# Patient Record
Sex: Female | Born: 1960 | Race: Black or African American | Hispanic: No | Marital: Single | Smoking: Never smoker
Health system: Southern US, Community
[De-identification: ages and names within clinical notes are randomized; demographics above are authoritative.]

## PROBLEM LIST (undated history)

## (undated) DIAGNOSIS — M5430 Sciatica, unspecified side: Secondary | ICD-10-CM

## (undated) DIAGNOSIS — J40 Bronchitis, not specified as acute or chronic: Secondary | ICD-10-CM

---

## 2002-06-11 ENCOUNTER — Emergency Department (HOSPITAL_COMMUNITY): Admission: EM | Admit: 2002-06-11 | Discharge: 2002-06-11 | Payer: Self-pay | Admitting: Emergency Medicine

## 2002-09-26 ENCOUNTER — Emergency Department (HOSPITAL_COMMUNITY): Admission: EM | Admit: 2002-09-26 | Discharge: 2002-09-26 | Payer: Self-pay | Admitting: Emergency Medicine

## 2002-10-03 ENCOUNTER — Inpatient Hospital Stay (HOSPITAL_COMMUNITY): Admission: AD | Admit: 2002-10-03 | Discharge: 2002-10-03 | Payer: Self-pay | Admitting: Family Medicine

## 2003-02-03 ENCOUNTER — Emergency Department (HOSPITAL_COMMUNITY): Admission: EM | Admit: 2003-02-03 | Discharge: 2003-02-03 | Payer: Self-pay | Admitting: Emergency Medicine

## 2003-02-03 ENCOUNTER — Encounter: Payer: Self-pay | Admitting: Emergency Medicine

## 2003-07-01 ENCOUNTER — Inpatient Hospital Stay (HOSPITAL_COMMUNITY): Admission: AD | Admit: 2003-07-01 | Discharge: 2003-07-01 | Payer: Self-pay | Admitting: Family Medicine

## 2003-08-23 ENCOUNTER — Emergency Department (HOSPITAL_COMMUNITY): Admission: EM | Admit: 2003-08-23 | Discharge: 2003-08-23 | Payer: Self-pay | Admitting: Emergency Medicine

## 2004-09-16 ENCOUNTER — Inpatient Hospital Stay (HOSPITAL_COMMUNITY): Admission: AD | Admit: 2004-09-16 | Discharge: 2004-09-16 | Payer: Self-pay | Admitting: Obstetrics & Gynecology

## 2006-07-21 ENCOUNTER — Emergency Department (HOSPITAL_COMMUNITY): Admission: EM | Admit: 2006-07-21 | Discharge: 2006-07-21 | Payer: Self-pay | Admitting: Emergency Medicine

## 2007-03-03 ENCOUNTER — Emergency Department (HOSPITAL_COMMUNITY): Admission: EM | Admit: 2007-03-03 | Discharge: 2007-03-03 | Payer: Self-pay | Admitting: Emergency Medicine

## 2007-03-05 ENCOUNTER — Emergency Department (HOSPITAL_COMMUNITY): Admission: EM | Admit: 2007-03-05 | Discharge: 2007-03-05 | Payer: Self-pay | Admitting: Emergency Medicine

## 2007-03-09 ENCOUNTER — Emergency Department (HOSPITAL_COMMUNITY): Admission: EM | Admit: 2007-03-09 | Discharge: 2007-03-09 | Payer: Self-pay | Admitting: Family Medicine

## 2009-02-10 ENCOUNTER — Emergency Department (HOSPITAL_COMMUNITY): Admission: EM | Admit: 2009-02-10 | Discharge: 2009-02-10 | Payer: Self-pay | Admitting: Emergency Medicine

## 2009-07-28 IMAGING — US US MULTISYS EXAM
1 series · 14 of 14 positions shown · non-contrast
Comparison: NONE

CLINICAL DATA: Right leg mass. 

RIGHT LOWER EXTREMITY ULTRASOUND

[Series 1: us rle non v · 0.03mm/px · 14 of 14 slices shown]
[im 1/14]
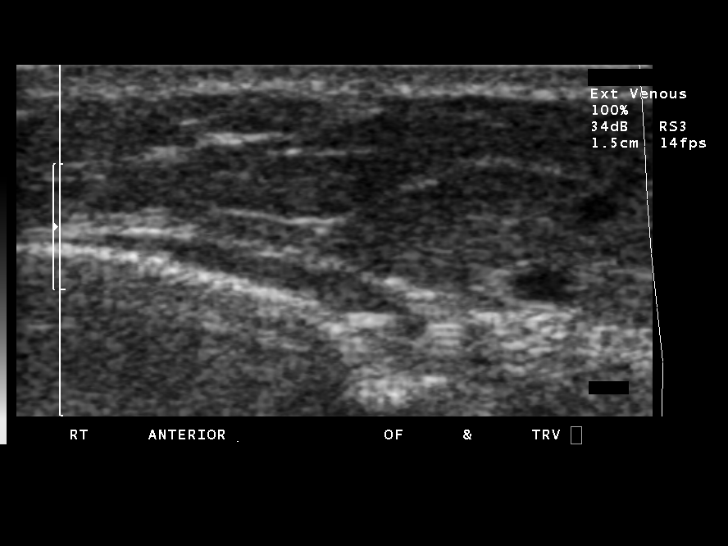
[im 2/14]
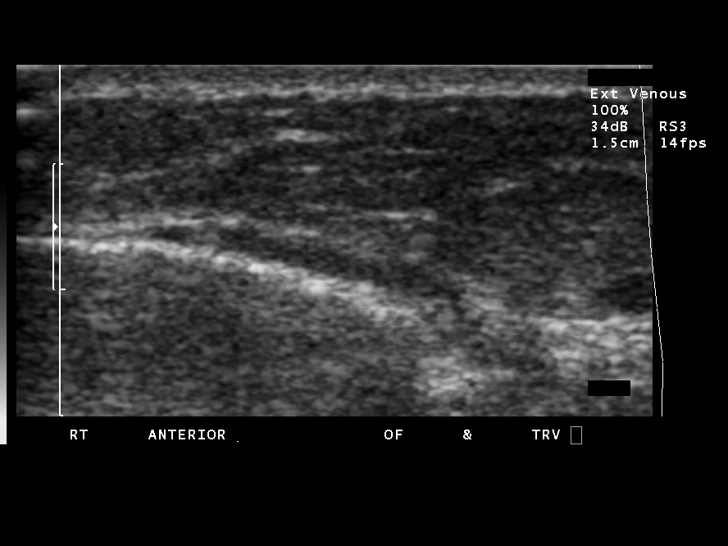
[im 3/14]
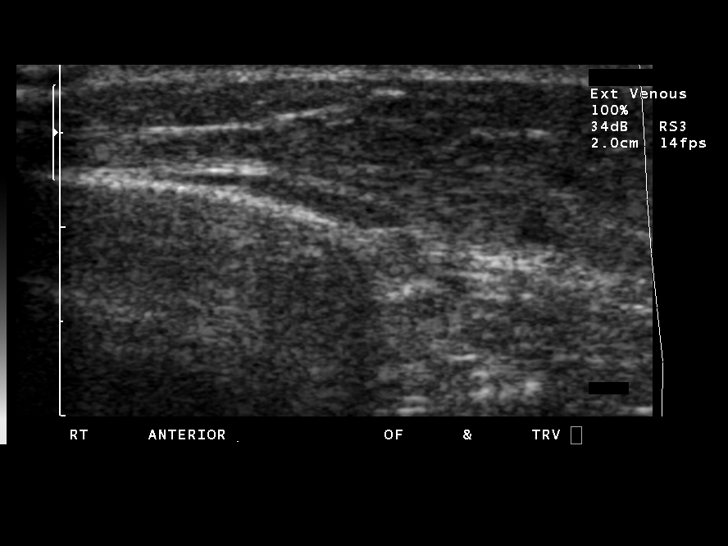
[im 4/14]
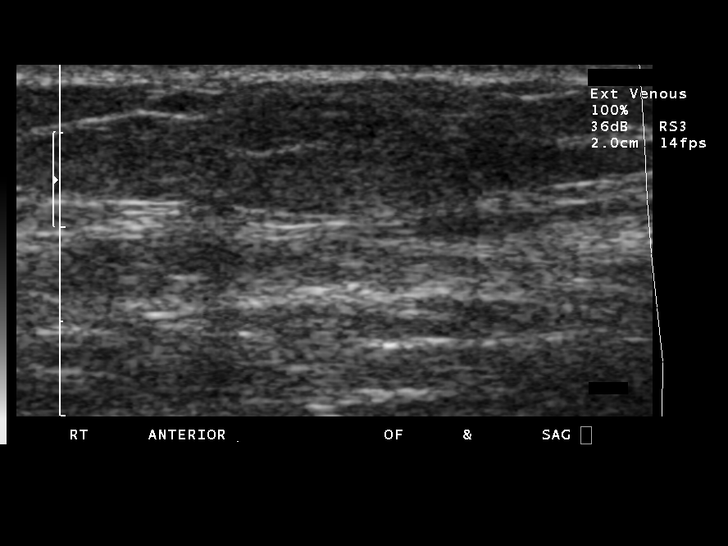
[im 5/14]
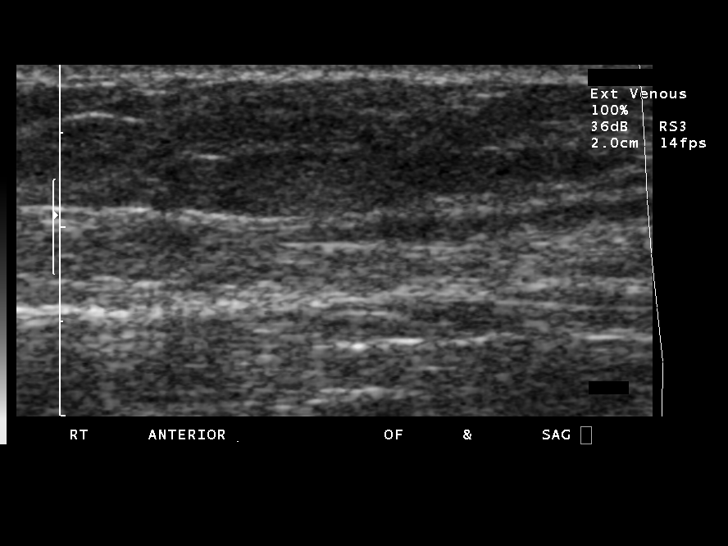
[im 6/14]
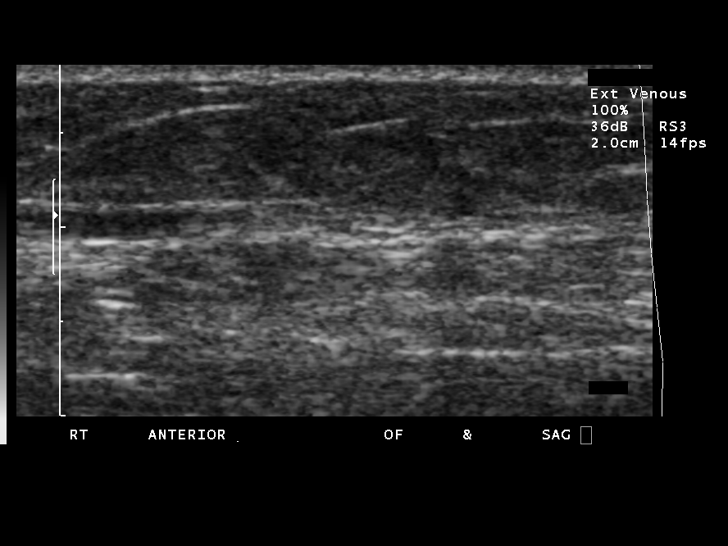
[im 7/14]
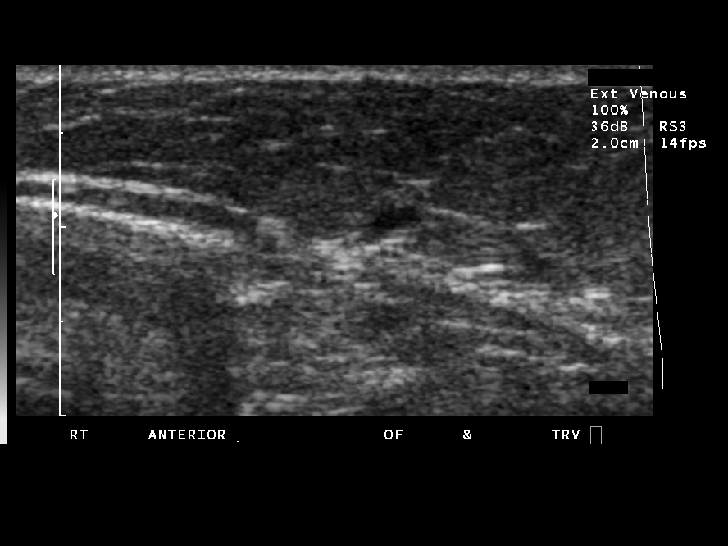
[im 8/14]
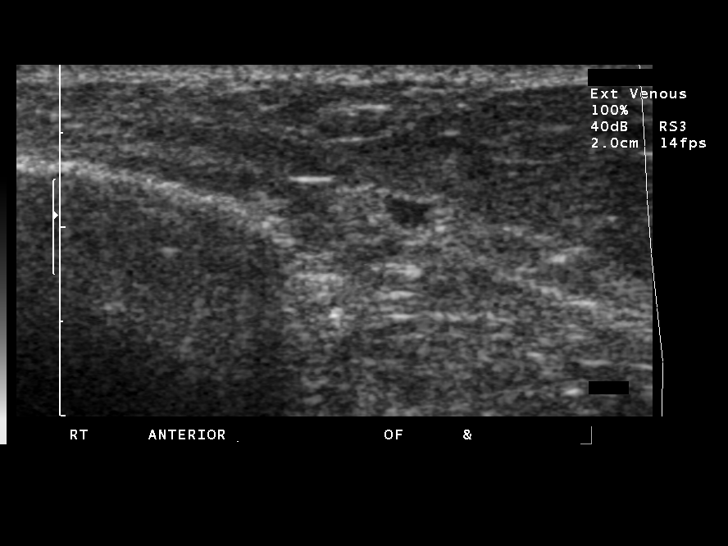
[im 9/14]
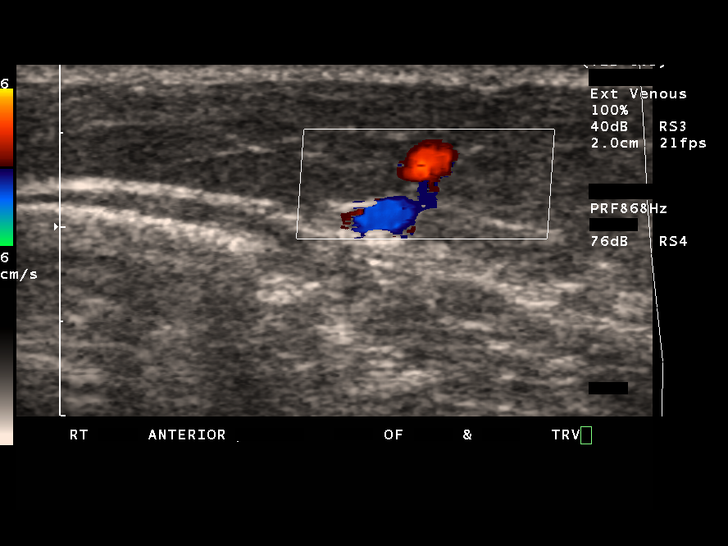
[im 10/14]
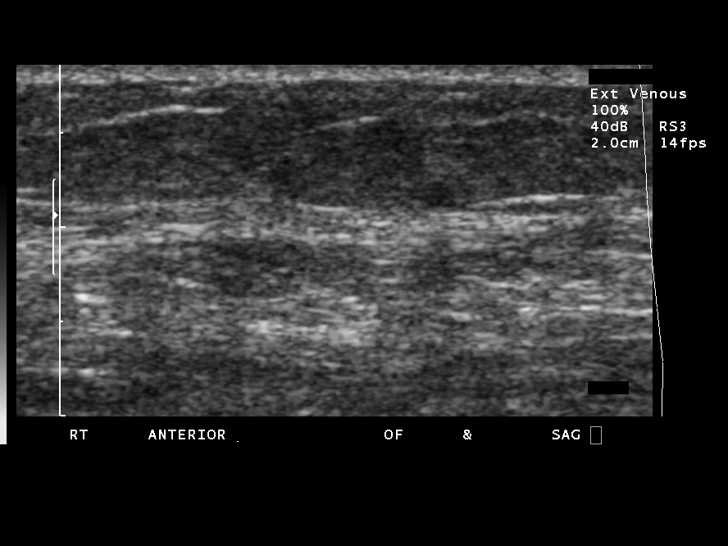
[im 11/14]
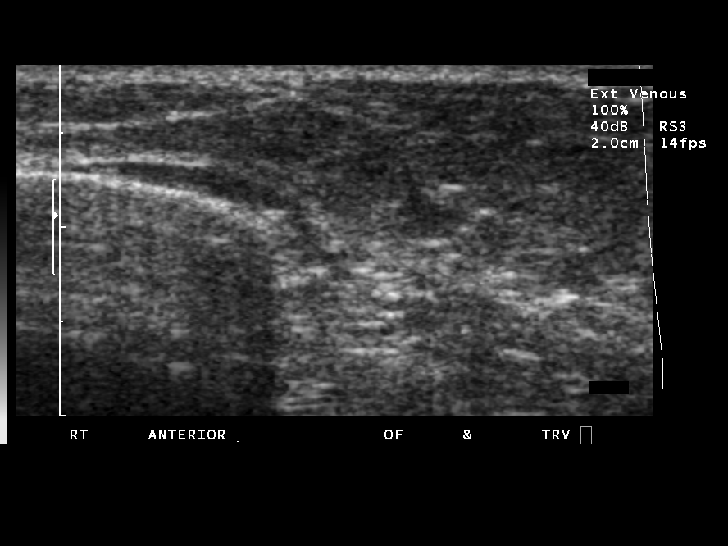
[im 12/14]
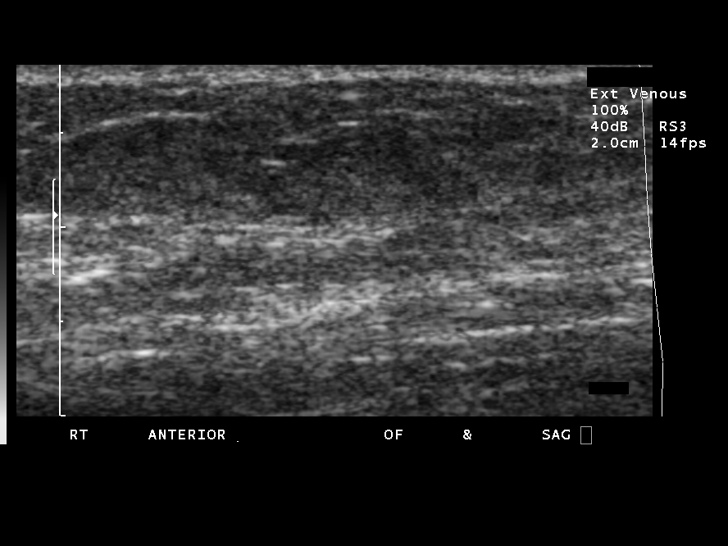
[im 13/14]
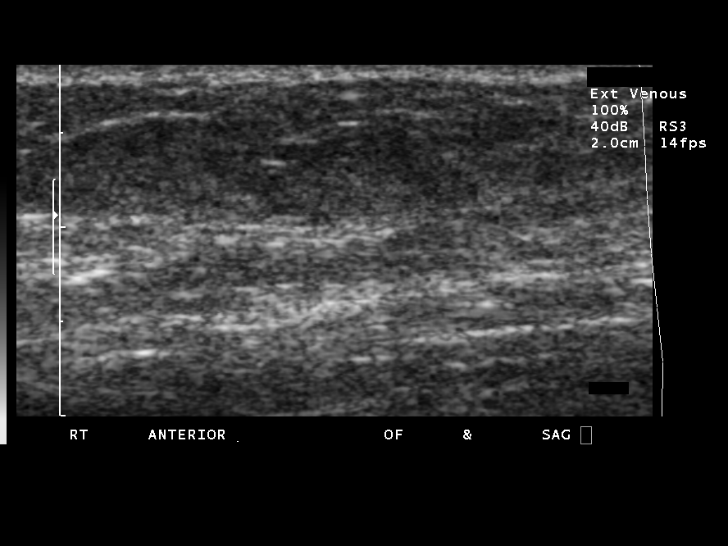
[im 14/14]
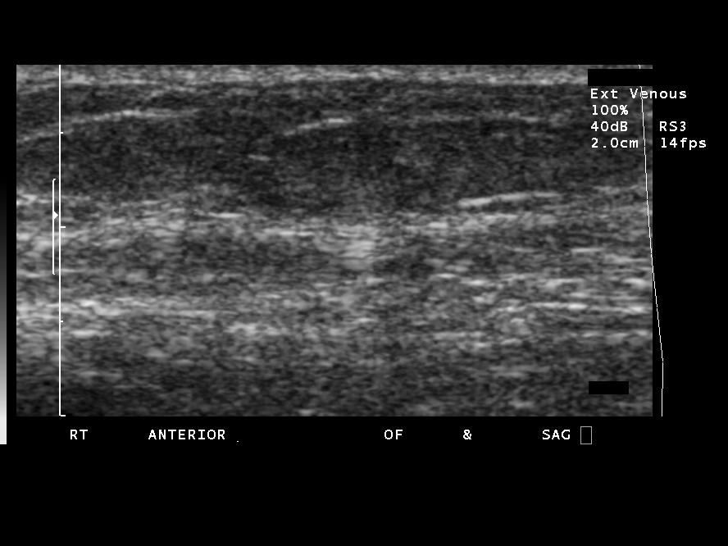

[14 of 14 positions shown; findings below may reference images not displayed]

FINDINGS: Evaluation of the palpable area in the right lower 
extremity demonstrates no evidence of focal or discrete mass.
IMPRESSION: No evidence of mass identified by ultrasound. If 
clinical symptoms persist or worsen, MRI may aid in further 
evaluation. Nerio, Tico electronically reviewed on 
05/12/2007 Dict Date: 05/10/2007  Tran Date:  05/12/2007 DAS  [REDACTED]

## 2010-09-01 LAB — POCT I-STAT, CHEM 8
BUN: 9 mg/dL (ref 6–23)
Calcium, Ion: 1.08 mmol/L — ABNORMAL LOW (ref 1.12–1.32)
Chloride: 107 meq/L (ref 96–112)
Creatinine, Ser: 1 mg/dL (ref 0.4–1.2)
Glucose, Bld: 90 mg/dL (ref 70–99)
HCT: 42 % (ref 36.0–46.0)
Hemoglobin: 14.3 g/dL (ref 12.0–15.0)
Potassium: 7.5 meq/L (ref 3.5–5.1)
Sodium: 137 meq/L (ref 135–145)
TCO2: 25 mmol/L (ref 0–100)

## 2010-09-01 LAB — BASIC METABOLIC PANEL
BUN: 8 mg/dL (ref 6–23)
Calcium: 8.9 mg/dL (ref 8.4–10.5)
Calcium: 9.5 mg/dL (ref 8.4–10.5)
Chloride: 110 mEq/L (ref 96–112)
Creatinine, Ser: 0.7 mg/dL (ref 0.4–1.2)
Creatinine, Ser: 0.81 mg/dL (ref 0.4–1.2)
GFR calc Af Amer: >60 mL/min (ref >60)
GFR calc non Af Amer: >60 mL/min (ref >60)
Glucose, Bld: 94 mg/dL (ref 70–99)

## 2010-09-01 LAB — URINALYSIS, ROUTINE W REFLEX MICROSCOPIC
Ketones, ur: NEGATIVE mg/dL
Nitrite: NEGATIVE
Protein, ur: NEGATIVE mg/dL

## 2010-09-01 LAB — CBC
Platelets: 223 10*3/uL (ref 150–400)
RDW: 12.9 % (ref 11.5–15.5)

## 2010-09-01 LAB — DIFFERENTIAL
Basophils Absolute: 0.1 10*3/uL (ref 0.0–0.1)
Eosinophils Relative: 1 % (ref 0–5)
Lymphocytes Relative: 42 % (ref 12–46)
Neutro Abs: 1.6 10*3/uL — ABNORMAL LOW (ref 1.7–7.7)
Neutrophils Relative %: 46 % (ref 43–77)

## 2010-09-01 LAB — HEMOCCULT GUIAC POC 1CARD (OFFICE): Fecal Occult Bld: NEGATIVE

## 2022-04-01 ENCOUNTER — Encounter (HOSPITAL_COMMUNITY): Payer: Self-pay | Admitting: Emergency Medicine

## 2022-04-01 ENCOUNTER — Emergency Department (HOSPITAL_COMMUNITY)
Admission: EM | Admit: 2022-04-01 | Discharge: 2022-04-01 | Disposition: A | Discharge status: Home / Self Care | Payer: Medicare Other | Attending: Emergency Medicine | Admitting: Emergency Medicine

## 2022-04-01 ENCOUNTER — Other Ambulatory Visit: Payer: Self-pay

## 2022-04-01 DIAGNOSIS — X500XXA Overexertion from strenuous movement or load, initial encounter: Secondary | ICD-10-CM | POA: Insufficient documentation

## 2022-04-01 DIAGNOSIS — M5441 Lumbago with sciatica, right side: Secondary | ICD-10-CM | POA: Diagnosis not present

## 2022-04-01 DIAGNOSIS — M542 Cervicalgia: Secondary | ICD-10-CM | POA: Diagnosis present

## 2022-04-01 DIAGNOSIS — S161XXA Strain of muscle, fascia and tendon at neck level, initial encounter: Secondary | ICD-10-CM | POA: Insufficient documentation

## 2022-04-01 MED ORDER — OXYCODONE HCL 5 MG PO TABS
5.0000 mg | ORAL_TABLET | Freq: Once | ORAL | Status: DC
  Filled 2022-04-01: qty 1

## 2022-04-01 MED ORDER — NAPROXEN SODIUM 550 MG PO TABS: 550.0000 mg | ORAL_TABLET | Freq: Two times a day (BID) | ORAL | 0 refills | Status: AC

## 2022-04-01 MED ORDER — ALBUTEROL SULFATE HFA 108 (90 BASE) MCG/ACT IN AERS
2.0000 | INHALATION_SPRAY | Freq: Once | RESPIRATORY_TRACT | Status: AC
  Administered 2022-04-01: 2 via RESPIRATORY_TRACT
  Filled 2022-04-01: qty 6.7

## 2022-04-01 MED ORDER — ACETAMINOPHEN 500 MG PO TABS
1000.0000 mg | ORAL_TABLET | Freq: Once | ORAL | Status: DC
  Filled 2022-04-01: qty 2

## 2022-04-01 MED ORDER — KETOROLAC TROMETHAMINE 15 MG/ML IJ SOLN
15.0000 mg | Freq: Once | INTRAMUSCULAR | Status: DC
  Filled 2022-04-01: qty 1

## 2022-04-01 NOTE — ED Triage Notes (Signed)
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 Pt states the pain runs from hip down to leg. Hx of sciatica, but states this feels worse than normal

## 2022-04-01 NOTE — ED Provider Notes (Signed)
[facility]-EMERGENCY DEPT Provider Note   CSN: [id number] Arrival date & time: 04/01/22  0418     History  Chief Complaint  Patient presents with   Back Pain    Holly Lutz is a 61 y.o. female.  61 yo F With a chief complaints of right-sided low back pain.  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  She also felt like she had strained her neck.  Has a history of right-sided low back pain.  Has radiation down into the buttock.  She denies trauma denies loss of bowel or bladder denies loss of rectal sensation denies numbness or weakness to the leg.  The neck pain is to the left lateral side.  Worse with certain positions and palpation.  She denies numbness or weakness to her arms.   Back Pain      Home Medications Prior to Admission medications   Not on File      Allergies    Patient has no allergy information on record.    Review of Systems   Review of Systems  Musculoskeletal:  Positive for back pain.    Physical Exam Updated Vital Signs BP 112/77   Pulse 73   Temp 98 F (36.7 C) (Oral)   Resp 18   Ht 5\' 5"  (1.651 m)   Wt 104.3 kg   SpO2 98%   BMI 38.27 kg/m  Physical Exam Vitals and nursing note reviewed.  Constitutional:      General: She is not in acute distress.    Appearance: She is well-developed. She is not diaphoretic.  HENT:     Head: Normocephalic and atraumatic.  Eyes:     Pupils: Pupils are equal, round, and reactive to light.  Cardiovascular:     Rate and Rhythm: Normal rate and regular rhythm.     Heart sounds: No murmur heard.    No friction rub. No gallop.  Pulmonary:     Effort: Pulmonary effort is normal.     Breath sounds: No wheezing or rales.  Abdominal:     General: There is no distension.     Palpations: Abdomen is soft.     Tenderness: There is no abdominal tenderness.  Musculoskeletal:        General: No tenderness.     Cervical back: Normal  range of motion and neck supple.     Comments: Pulse motor and sensation intact to bilateral lower extremities.  Reflexes are 2+ there is no clonus negative Babinski bilaterally.  Negative straight leg raise test bilaterally.  Skin:    General: Skin is warm and dry.  Neurological:     Mental Status: She is alert and oriented to person, place, and time.  Psychiatric:        Behavior: Behavior normal.     ED Results / Procedures / Treatments   Labs (all labs ordered are listed, but only abnormal results are displayed) Labs Reviewed - No data to display  EKG None  Radiology No results found.  Procedures Procedures    Medications Ordered in ED Medications  acetaminophen (TYLENOL) tablet 1,000 mg (has no administration in time range)  ketorolac (TORADOL) 15 MG/ML injection 15 mg (has no administration in time range)  oxyCODONE (Oxy IR/ROXICODONE) immediate release tablet 5 mg (has no administration in time range)  albuterol (VENTOLIN HFA) 108 (90 Base) MCG/ACT inhaler 2 puff (has no administration in time range)    ED  Course/ Medical Decision Making/ A&P                           Medical Decision Making Risk OTC drugs. Prescription drug management.   61 yo F With a chief complaints of right-sided low back pain.  This is in the setting of her lifting heavy objects.  Atraumatic benign exam.  We will treat as musculoskeletal.  PCP follow-up.  She is also complaining of cough and has run out of her inhaler.  No wheezing on my exam.  We will give her an inhaler to take home.  Patient was also complaining of neck pain.  Benign exam.  Consistent with strain.  Will discharge home.  6:19 AM:  I have discussed the diagnosis/risks/treatment options with the patient.  Evaluation and diagnostic testing in the emergency department does not suggest an emergent condition requiring admission or immediate intervention beyond what has been performed at this time.  They will follow up with  PCP. We also discussed returning to the ED immediately if new or worsening sx occur. We discussed the sx which are most concerning (e.g., sudden worsening pain, fever, inability to tolerate by mouth, cauda equina s/sx) that necessitate immediate return. Medications administered to the patient during their visit and any new prescriptions provided to the patient are listed below.  Medications given during this visit Medications  acetaminophen (TYLENOL) tablet 1,000 mg (has no administration in time range)  ketorolac (TORADOL) 15 MG/ML injection 15 mg (has no administration in time range)  oxyCODONE (Oxy IR/ROXICODONE) immediate release tablet 5 mg (has no administration in time range)  albuterol (VENTOLIN HFA) 108 (90 Base) MCG/ACT inhaler 2 puff (has no administration in time range)     The patient appears reasonably screen and/or stabilized for discharge and I doubt any other medical condition or other [facility] requiring further screening, evaluation, or treatment in the ED at this time prior to discharge.          Final Clinical Impression(s) / ED Diagnoses Final diagnoses:  Acute right-sided low back pain with right-sided sciatica  Acute strain of neck muscle, initial encounter    Rx / DC Orders ED Discharge Orders     None         Deno Etienne, DO 04/01/22 [phone]

## 2022-04-01 NOTE — Discharge Instructions (Signed)

## 2022-04-14 ENCOUNTER — Other Ambulatory Visit: Payer: Self-pay

## 2022-04-14 ENCOUNTER — Encounter (HOSPITAL_COMMUNITY): Payer: Self-pay | Admitting: Emergency Medicine

## 2022-04-14 ENCOUNTER — Emergency Department (HOSPITAL_COMMUNITY): Payer: Medicare Other

## 2022-04-14 ENCOUNTER — Emergency Department (HOSPITAL_COMMUNITY)
Admission: EM | Admit: 2022-04-14 | Discharge: 2022-04-14 | Discharge status: Against Medical Advice | Payer: Medicare Other | Attending: Emergency Medicine | Admitting: Emergency Medicine

## 2022-04-14 DIAGNOSIS — M5442 Lumbago with sciatica, left side: Secondary | ICD-10-CM | POA: Diagnosis not present

## 2022-04-14 DIAGNOSIS — M5441 Lumbago with sciatica, right side: Secondary | ICD-10-CM | POA: Diagnosis not present

## 2022-04-14 DIAGNOSIS — M5440 Lumbago with sciatica, unspecified side: Secondary | ICD-10-CM

## 2022-04-14 DIAGNOSIS — R32 Unspecified urinary incontinence: Secondary | ICD-10-CM | POA: Diagnosis not present

## 2022-04-14 DIAGNOSIS — R052 Subacute cough: Secondary | ICD-10-CM | POA: Diagnosis not present

## 2022-04-14 DIAGNOSIS — M545 Low back pain, unspecified: Secondary | ICD-10-CM | POA: Diagnosis present

## 2022-04-14 LAB — CBC WITH DIFFERENTIAL/PLATELET
Abs Immature Granulocytes: 0.01 10*3/uL (ref 0.00–0.07)
Basophils Absolute: 0.1 10*3/uL (ref 0.0–0.1)
Basophils Relative: 1 %
Eosinophils Absolute: 0.1 10*3/uL (ref 0.0–0.5)
Eosinophils Relative: 2 %
HCT: 38.9 % (ref 36.0–46.0)
Hemoglobin: 12.9 g/dL (ref 12.0–15.0)
Immature Granulocytes: 0 %
Lymphocytes Relative: 30 %
Lymphs Abs: 1.6 10*3/uL (ref 0.7–4.0)
MCH: 29.1 pg (ref 26.0–34.0)
MCHC: 33.2 g/dL (ref 30.0–36.0)
MCV: 87.6 fL (ref 80.0–100.0)
Monocytes Absolute: 0.4 10*3/uL (ref 0.1–1.0)
Monocytes Relative: 8 %
Neutro Abs: 3.2 10*3/uL (ref 1.7–7.7)
Neutrophils Relative %: 59 %
Platelets: 266 10*3/uL (ref 150–400)
RBC: 4.44 MIL/uL (ref 3.87–5.11)
RDW: 12.8 % (ref 11.5–15.5)
WBC: 5.4 10*3/uL (ref 4.0–10.5)
nRBC: 0 % (ref 0.0–0.2)

## 2022-04-14 LAB — BASIC METABOLIC PANEL
Anion gap: 10 (ref 5–15)
BUN: 11 mg/dL (ref 8–23)
CO2: 24 mmol/L (ref 22–32)
Calcium: 9.7 mg/dL (ref 8.9–10.3)
Chloride: 107 mmol/L (ref 98–111)
Creatinine, Ser: 0.96 mg/dL (ref 0.44–1.00)
GFR, Estimated: >60 mL/min (ref >60)
Glucose, Bld: 110 mg/dL — ABNORMAL HIGH (ref 70–99)
Potassium: 3.3 mmol/L — ABNORMAL LOW (ref 3.5–5.1)
Sodium: 141 mmol/L (ref 135–145)

## 2022-04-14 LAB — URINALYSIS, ROUTINE W REFLEX MICROSCOPIC
Bilirubin Urine: NEGATIVE
Glucose, UA: NEGATIVE mg/dL
Hgb urine dipstick: NEGATIVE
Ketones, ur: NEGATIVE mg/dL
Leukocytes,Ua: NEGATIVE
Nitrite: NEGATIVE
Protein, ur: NEGATIVE mg/dL
Specific Gravity, Urine: 1.017 (ref 1.005–1.030)
pH: 6 (ref 5.0–8.0)

## 2022-04-14 MED ORDER — AMOXICILLIN-POT CLAVULANATE 875-125 MG PO TABS: 1.0000 | ORAL_TABLET | Freq: Two times a day (BID) | ORAL | 0 refills | Status: AC

## 2022-04-14 NOTE — ED Triage Notes (Signed)
Pt brought to ED by [facility] with c/o bladder pain, sciatic nerve pain, and possible UTI. Pt reports urinary urgency and frequency.   EMS Vitals BP 134/80 HR 92 SPO2 96% RA

## 2022-04-14 NOTE — ED Provider Notes (Signed)
[facility] EMERGENCY DEPARTMENT Provider Note   CSN: [id number] Arrival date & time: 04/14/22  0111     History No chief complaint on file.   Holly Lutz is a 61 y.o. female.  Patient presenting with the emergency department with multiple complaints.  First she has had a cough for over a month now.  She says she sometimes coughs up yellow sputum.  She was diagnosed with bronchitis and was given an inhaler that did not help at all.  She thinks she might need an antibiotic at this point.  She feels like her symptoms are just worsening.  She had some associated shortness of breath.  No chest pain.  Her other complaint is that she now has some urinary issues.  She says over the past 3 days she has had some pressure in her suprapubic area as well as feeling like she is leaking urine.  She says when she coughs a lot at times the urine comes out.  She also states that it will come out other times when she is not expecting it.  She states when she tries to go to the bathroom in the toilet that nothing comes out, however she feels like she has a lot of urine. This is not normal for her at all. She is concerned for UTI.  She also reports that she was recently seen here about one week ago after lifting heavy objects and experiencing some new lower back pain that is worsened from her chronic sciatica.  She has been taking naproxen for this without much relief.  HPI     Home Medications Prior to Admission medications   Medication Sig Start Date End Date Taking? Authorizing Provider  amoxicillin-clavulanate (AUGMENTIN) 875-125 MG tablet Take 1 tablet by mouth every 12 (twelve) hours for 5 days. 04/14/22 04/19/22 Yes Amal Renbarger, Finis Bud, PA-C  naproxen sodium (ANAPROX DS) 550 MG tablet Take 1 tablet (550 mg total) by mouth 2 (two) times daily with a meal for 14 days. 04/01/22 04/15/22  Melene Plan, DO      Allergies    Patient has no allergy information on record.    Review of  Systems   Review of Systems  Respiratory:  Positive for cough and shortness of breath.   Gastrointestinal:  Positive for abdominal pain.  Genitourinary:  Positive for difficulty urinating.       Urinary incontinance  Musculoskeletal:  Positive for back pain.  All other systems reviewed and are negative.   Physical Exam Updated Vital Signs BP (!) 151/79   Pulse 66   Temp 97.9 F (36.6 C) (Oral)   Resp 17   SpO2 96%  Physical Exam Vitals and nursing note reviewed.  Constitutional:      General: She is not in acute distress.    Appearance: Normal appearance. She is not ill-appearing, toxic-appearing or diaphoretic.  HENT:     Head: Normocephalic and atraumatic.     Nose: No nasal deformity.     Mouth/Throat:     Lips: Pink. No lesions.     Mouth: Mucous membranes are moist. No injury, lacerations, oral lesions or angioedema.     Pharynx: Oropharynx is clear. Uvula midline. No pharyngeal swelling, oropharyngeal exudate, posterior oropharyngeal erythema or uvula swelling.  Eyes:     General: Gaze aligned appropriately. No scleral icterus.       Right eye: No discharge.        Left eye: No discharge.     Conjunctiva/sclera: Conjunctivae  normal.     Right eye: Right conjunctiva is not injected. No exudate or hemorrhage.    Left eye: Left conjunctiva is not injected. No exudate or hemorrhage.    Pupils: Pupils are equal, round, and reactive to light.  Cardiovascular:     Rate and Rhythm: Normal rate and regular rhythm.     Pulses: Normal pulses.          Radial pulses are 2+ on the right side and 2+ on the left side.       Dorsalis pedis pulses are 2+ on the right side and 2+ on the left side.     Heart sounds: Normal heart sounds, S1 normal and S2 normal. Heart sounds not distant. No murmur heard.    No friction rub. No gallop. No S3 or S4 sounds.  Pulmonary:     Effort: Pulmonary effort is normal. No accessory muscle usage or respiratory distress.     Breath sounds: Normal  breath sounds. No stridor. No wheezing, rhonchi or rales.     Comments: Nonproductive cough noted Chest:     Chest wall: No tenderness.  Abdominal:     General: Abdomen is flat. There is no distension.     Palpations: Abdomen is soft. There is no mass or pulsatile mass.     Tenderness: There is abdominal tenderness. There is no guarding or rebound.     Comments: Suprapubic tenderness  Musculoskeletal:     Right lower leg: No edema.     Left lower leg: No edema.     Comments: No midline tenderness of spine, no stepoff or deformity; reproducible muscular tenderness in paraspinal muscles DP/PT pulses 2+ and equal bilaterally No leg edema Sensation grossly intact on anterior thighs, dorsum of foot and lateral foot Decreased strength of RLE, normal strength of LLE    Skin:    General: Skin is warm and dry.     Coloration: Skin is not jaundiced or pale.     Findings: No bruising, erythema, lesion or rash.  Neurological:     General: No focal deficit present.     Mental Status: She is alert and oriented to person, place, and time.     GCS: GCS eye subscore is 4. GCS verbal subscore is 5. GCS motor subscore is 6.  Psychiatric:        Mood and Affect: Mood normal.        Behavior: Behavior normal. Behavior is cooperative.     ED Results / Procedures / Treatments   Labs (all labs ordered are listed, but only abnormal results are displayed) Labs Reviewed  BASIC METABOLIC PANEL - Abnormal; Notable for the following components:      Result Value   Potassium 3.3 (*)    Glucose, Bld 110 (*)    All other components within normal limits  URINALYSIS, ROUTINE W REFLEX MICROSCOPIC - Abnormal; Notable for the following components:   APPearance HAZY (*)    All other components within normal limits  URINE CULTURE  CBC WITH DIFFERENTIAL/PLATELET    EKG None  Radiology DG Chest 2 View  Result Date: 04/14/2022 CLINICAL DATA:  Pneumonia.  Bronchitis and cough.  Syncope. EXAM: CHEST - 2  VIEW COMPARISON:  March 05, 2007 FINDINGS: The heart size and mediastinal contours are within normal limits. Both lungs are clear. The visualized skeletal structures are unremarkable. IMPRESSION: No active cardiopulmonary disease. Electronically Signed   By: Gerome Sam III M.D.   On: 04/14/2022 08:54  Procedures Procedures   Medications Ordered in ED Medications - No data to display  ED Course/ Medical Decision Making/ A&P Clinical Course as of 04/14/22 1214  Sat Apr 14, 2022  4469 Patient voided over 300 cc, has 250 cc residual in bladder scan. Ordered MRI lumbar to r/o cauda equina [GL]  1048 Patient elects to leave AMA. I recommended MRI to r/o cauda equina given urinary retention issues. I discussed that she does not have a UTI and abx are not indicated. I recommend courses of steroids for her bronchitis, but no sign of pneumonia was found today. I did not recommend antibiotics for this, but she refused steroids and insisted on antibiotic prescription [GL]    Clinical Course User Index [GL] Canon Gola, Finis Bud, PA-C                           Medical Decision Making Amount and/or Complexity of Data Reviewed Radiology: ordered.  Risk Prescription drug management.   She is presenting with multiple complaints.  She has a cough that is been going on for over a month now.  He was diagnosed with bronchitis then.  She has not responded to inhaler therapy.  She says is happened to her multiple times that she only responds antibiotics.  We got a chest x-ray which did not reveal any sign of pneumonia.  Her lungs are clear on exam.  Is not febrile does not have an elevated white count.  I do not feel this is pneumonia, however patient is insistent on antibiotic prescription.  I have offered steroid therapy, but she has declined.  She also has this new urinary incontinence.  The way her description sounds, and almost sounds like she may have some overflow incontinence.  She was able to  urinate for [place] to get a urine sample which did not show any sign of urinary tract infection.  We had a postvoid residual which still had 250 cc residual in the bladder.  No AKI associated. Exam otherwise reassuring. In the setting of recent back injury and lower back pain, this is concerning for possible cauda equina syndrome.  I recommend an MRI the patient, however she refuses this and does not feel anything is wrong with her spine.  She feels that she has a urinary tract infection.  She says that she needs to leave now.  I have discussed with her that she will need to leave AGAINST MEDICAL ADVICE.  She is given return precautions if her symptoms worsen.   Final Clinical Impression(s) / ED Diagnoses Final diagnoses:  Urinary incontinence, unspecified type  Acute bilateral low back pain with sciatica, sciatica laterality unspecified  Subacute cough    Rx / DC Orders ED Discharge Orders          Ordered    amoxicillin-clavulanate (AUGMENTIN) 875-125 MG tablet  Every 12 hours        04/14/22 1042              Daina Cara, Finis Bud, PA-C 04/14/22 1217    Vanetta Mulders, MD 04/17/22 1544

## 2022-04-14 NOTE — ED Notes (Signed)
Asked pt about urine sample, pt keeps forgetting to provide one when pt goes to bathroom. Pt stated "will get it next time pt goes to bathroom".

## 2022-04-14 NOTE — ED Notes (Signed)
Patient ambulating to restroom to provide urine specimen

## 2022-04-14 NOTE — ED Provider Triage Note (Signed)
Emergency Medicine Provider Triage Evaluation Note  Laycee Fitzsimmons , a 61 y.o. female  was evaluated in triage.  Pt complains of sciatica, urinary frequency/dysuria, and concern for UTI.  Symptoms ongoing x3 days.  Denies fever.  Review of Systems  Positive: Dysuria, frequency, back pain Negative: fever  Physical Exam  BP (!) 151/79   Pulse 66   Temp 97.9 F (36.6 C) (Oral)   Resp 17   SpO2 96%   Gen:   Awake, no distress   Resp:  Normal effort  MSK:   Moves extremities without difficulty  Other:    Medical Decision Making  Medically screening exam initiated at 1:13 AM.  Appropriate orders placed.  Adelena Desantiago was informed that the remainder of the evaluation will be completed by another provider, this initial triage assessment does not replace that evaluation, and the importance of remaining in the ED until their evaluation is complete.     Garlon Hatchet, PA-C 04/14/22 2212

## 2022-04-14 NOTE — Discharge Instructions (Signed)
You have chosen to leave AGAINST MEDICAL ADVICE. Should you change your mind, you are always welcome and encouraged to return to the ED. You are encouraged to follow-up with, at the very least, a primary care provider, or other similar medical professional on this matter.   I sent antibiotic with you. Please return if symptoms become worse.

## 2022-04-15 ENCOUNTER — Emergency Department (HOSPITAL_COMMUNITY)
Admission: EM | Admit: 2022-04-15 | Discharge: 2022-04-16 | Disposition: A | Discharge status: Home / Self Care | Payer: Medicare Other | Attending: Emergency Medicine | Admitting: Emergency Medicine

## 2022-04-15 ENCOUNTER — Emergency Department (HOSPITAL_COMMUNITY): Payer: Medicare Other

## 2022-04-15 ENCOUNTER — Other Ambulatory Visit: Payer: Self-pay

## 2022-04-15 DIAGNOSIS — M5441 Lumbago with sciatica, right side: Secondary | ICD-10-CM | POA: Insufficient documentation

## 2022-04-15 DIAGNOSIS — M549 Dorsalgia, unspecified: Secondary | ICD-10-CM | POA: Diagnosis present

## 2022-04-15 LAB — URINE CULTURE

## 2022-04-15 NOTE — ED Triage Notes (Signed)
Pt brought to ED by [facility] with c/o sciatic nerve pain. Also endorses urinary pain.    EMS Vitals BP 120/80 HR 73 RR 18 SPO2 96%

## 2022-04-15 NOTE — ED Provider Triage Note (Signed)
Emergency Medicine Provider Triage Evaluation Note  Holly Lutz , a 61 y.o. female  was evaluated in triage.  Pt complains of sciatica pain after lifting heavy item today.  Also notes pain to her neck and shoulders after lifting heavy items because she was moving.  Notes that her sciatic pain radiates down the posterior right thigh. No meds tried PTA.  Notes that she was upset that she was not given any medications for her pain.  Patient also notes that she has bronchitis and notes that she was not evaluated for that.  Patient denies bowel incontinence, saddle paresthesia.  Notes that she has urinary incontinence every time that she is coughs.   Per patient chart review: Patient was evaluated emergency department yesterday for similar symptoms.  At that time patient had MRI lumbar ordered however patient left AMA.  Review of Systems  Positive:  Negative:   Physical Exam  BP 130/71   Pulse 82   Temp 98 F (36.7 C)   Resp 19   SpO2 100%  Gen:   Awake, no distress  Resp:  Normal effort  MSK:   Moves extremities without difficulty  Other:  +SLR left.  No spinal tenderness to palpation.  Tenderness to palpation noted to musculature of back on the left.  Medical Decision Making  Medically screening exam initiated at 11:00 PM.  Appropriate orders placed.  Holly Lutz was informed that the remainder of the evaluation will be completed by another provider, this initial triage assessment does not replace that evaluation, and the importance of remaining in the ED until their evaluation is complete.     Holly Lutz A, PA-C 04/15/22 2300

## 2022-04-16 LAB — URINALYSIS, ROUTINE W REFLEX MICROSCOPIC
Bilirubin Urine: NEGATIVE
Glucose, UA: NEGATIVE mg/dL
Hgb urine dipstick: NEGATIVE
Ketones, ur: NEGATIVE mg/dL
Leukocytes,Ua: NEGATIVE
Nitrite: NEGATIVE
Protein, ur: NEGATIVE mg/dL
Specific Gravity, Urine: 1.016 (ref 1.005–1.030)
pH: 5 (ref 5.0–8.0)

## 2022-04-16 MED ORDER — OXYCODONE-ACETAMINOPHEN 5-325 MG PO TABS
1.0000 | ORAL_TABLET | Freq: Once | ORAL | Status: DC
  Filled 2022-04-16: qty 1

## 2022-04-16 MED ORDER — CYCLOBENZAPRINE HCL 10 MG PO TABS: 10.0000 mg | ORAL_TABLET | Freq: Two times a day (BID) | ORAL | 0 refills | Status: AC | PRN

## 2022-04-16 MED ORDER — NAPROXEN 250 MG PO TABS
500.0000 mg | ORAL_TABLET | Freq: Once | ORAL | Status: AC
  Administered 2022-04-16: 500 mg via ORAL
  Filled 2022-04-16: qty 2

## 2022-04-16 MED ORDER — PREDNISONE 10 MG PO TABS: 30.0000 mg | ORAL_TABLET | Freq: Every day | ORAL | 0 refills | Status: AC

## 2022-04-16 NOTE — ED Notes (Signed)
Patient verbalizes understanding of d/c instructions. Opportunities for questions and answers were provided. Pt d/c from ED and wheeled to lobby where she is waiting for bus.

## 2022-04-16 NOTE — ED Provider Notes (Signed)
[facility] EMERGENCY DEPARTMENT Provider Note   CSN: [id number] Arrival date & time: 04/15/22  2241     History  Chief Complaint  Patient presents with   Back Pain    Siearra Lutz is a 61 y.o. female.  HPI   Patient medical history including bronchitis, presents with complaints of back pain, patient has back pains for about 1 week ago, states it started after she was trying to move some things within the storage unit, states she feels pain in her lower back around her right side, will feel pain radiate down her right leg with occasional paresthesias, she will occasionally have saddle paresthesias but has not this time, she does note that she is noticing some urinary incontinency, she states that initially was just with coughing but now will happen without her coughing, no bowel incontinency, she denies any traumatic injury, no illicit drug use, denies fevers chills cough congestion general body aches.  She does not endorse urinary urgency frequency dysuria no flank tenderness no suprapubic pain   Reviewed patient's chart which she was seen 2 days ago for similar presentation, at that time postvoid residual was obtained and she had 225 cc after voiding, it was recommended for MRI for rule out of possible spine equina, patient left AMA.    Home Medications Prior to Admission medications   Medication Sig Start Date End Date Taking? Authorizing Provider  cyclobenzaprine (FLEXERIL) 10 MG tablet Take 1 tablet (10 mg total) by mouth 2 (two) times daily as needed for muscle spasms. 04/16/22  Yes Carroll Sage, PA-C  predniSONE (DELTASONE) 10 MG tablet Take 3 tablets (30 mg total) by mouth daily for 5 days. 04/16/22 04/21/22 Yes Carroll Sage, PA-C  amoxicillin-clavulanate (AUGMENTIN) 875-125 MG tablet Take 1 tablet by mouth every 12 (twelve) hours for 5 days. 04/14/22 04/19/22  Loeffler, Finis Bud, PA-C      Allergies    Patient has no allergy information  on record.    Review of Systems   Review of Systems  Constitutional:  Negative for chills and fever.  Respiratory:  Negative for shortness of breath.   Cardiovascular:  Negative for chest pain.  Gastrointestinal:  Negative for abdominal pain.  Musculoskeletal:  Positive for back pain.  Neurological:  Negative for headaches.    Physical Exam Updated Vital Signs BP 130/71   Pulse 82   Temp 98 F (36.7 C)   Resp 19   SpO2 100%  Physical Exam Vitals and nursing note reviewed.  Constitutional:      General: She is not in acute distress.    Appearance: She is not ill-appearing.  HENT:     Head: Normocephalic and atraumatic.     Nose: No congestion.  Eyes:     Conjunctiva/sclera: Conjunctivae normal.  Cardiovascular:     Rate and Rhythm: Normal rate and regular rhythm.     Pulses: Normal pulses.  Pulmonary:     Effort: Pulmonary effort is normal.  Abdominal:     Palpations: Abdomen is soft.     Tenderness: There is no abdominal tenderness. There is no right CVA tenderness or left CVA tenderness.  Musculoskeletal:     Comments: Spine was palpated was tender to palpation along the lumbar spine without crepitus or deformities noted, she did have some tenderness along the muscles that surround the right iliac crest, she has no pelvis instability no leg shortening, she has 5 out of 5 strength in the lower extremities, she has 2+  dorsal pedal pulses, she endorses some slight decrease in sensation to light touch on the right leg in comparison to the left, she has 2+ patella and Achilles tendon reflexes.  Skin:    General: Skin is warm and dry.  Neurological:     Mental Status: She is alert.  Psychiatric:        Mood and Affect: Mood normal.     ED Results / Procedures / Treatments   Labs (all labs ordered are listed, but only abnormal results are displayed) Labs Reviewed  URINALYSIS, ROUTINE W REFLEX MICROSCOPIC - Abnormal; Notable for the following components:      Result  Value   APPearance HAZY (*)    Bacteria, UA RARE (*)    All other components within normal limits    EKG None  Radiology DG Cervical Spine Complete  Result Date: 04/16/2022 CLINICAL DATA:  Neck pain EXAM: CERVICAL SPINE - COMPLETE 4+ VIEW COMPARISON:  None Available. FINDINGS: No static subluxation. Reversal of normal cervical lordosis. Mild multilevel degenerative disc disease. No compression fracture. No prevertebral soft tissue swelling. IMPRESSION: Mild multilevel degenerative disc disease. No compression fracture or static subluxation. Electronically Signed   By: Deatra Robinson M.D.   On: 04/16/2022 02:13   DG Chest 2 View  Result Date: 04/14/2022 CLINICAL DATA:  Pneumonia.  Bronchitis and cough.  Syncope. EXAM: CHEST - 2 VIEW COMPARISON:  March 05, 2007 FINDINGS: The heart size and mediastinal contours are within normal limits. Both lungs are clear. The visualized skeletal structures are unremarkable. IMPRESSION: No active cardiopulmonary disease. Electronically Signed   By: Gerome Sam III M.D.   On: 04/14/2022 08:54    Procedures Procedures    Medications Ordered in ED Medications  naproxen (NAPROSYN) tablet 500 mg (500 mg Oral Given 04/16/22 0129)    ED Course/ Medical Decision Making/ A&P                           Medical Decision Making Amount and/or Complexity of Data Reviewed Labs: ordered.  Risk Prescription drug management.   This patient presents to the ED for concern of back pain, this involves an extensive number of treatment options, and is a complaint that carries with it a high risk of complications and morbidity.  The differential diagnosis includes compression fracture, spine equina, muscular strain    Additional history obtained:  Additional history obtained from N/A External records from outside source obtained and reviewed including previous notes   Co morbidities that complicate the patient evaluation  N/A  Social Determinants of  Health:  N/A    Lab Tests:  I Ordered, and personally interpreted labs.  The pertinent results include: UA unremarkable   Imaging Studies ordered:  I ordered imaging studies including cervical spine I independently visualized and interpreted imaging which showed negative acute findings I agree with the radiologist interpretation   Cardiac Monitoring:  The patient was maintained on a cardiac monitor.  I personally viewed and interpreted the cardiac monitored which showed an underlying rhythm of: N/A   Medicines ordered and prescription drug management:  I ordered medication including naproxen I have reviewed the patients home medicines and have made adjustments as needed  Critical Interventions:  N/A   Reevaluation:  Noted lower back pain, x-rays obtained imaging of the cervical spine, the patient endorsing lower lumbar spine, will repeat post residual void, obtain UA and reassess  Postvoid is 0  Patient agreeable plan discharge at this  time    Consultations Obtained:  N/A   Test Considered:  CT lumbar spine-shared decision making this will be deferred, suspicion for fracture is low no traumatic injury associate this pain.    Rule out I have low suspicion for spinal fracture or spinal cord abnormality as patient i.e. post residual void was 0, she has equal strength bilaterally, reflexes equal bilaterally, difficulty with bowel movements, denies saddle paresthesias patient is able to ambulate.  She does note that she has some urinary incontinency but this appears more related to coughing, she states when she is not coughing this does not happen. Spine was palpated there is no step-off, crepitus or gross deformities felt, patient had 5/5 strength, full range of motion, neurovascular fully intact in the lower extremities.  Suspicion for UTI Pilo kidney stone is also low UA is negative or signs of infection or hematuria she has no flank tenderness.  Suspicion for AAA  or dissection is also low presentation is atypical, pain is focalized reproducible, is worse with movement, she has low risk factors. Low suspicion for septic arthritis as patient denies IV drug use, skin exam was performed no erythematous, edema or warm joints noted.    Dispostion and problem list  After consideration of the diagnostic results and the patients response to treatment, I feel that the patent would benefit from discharge.  Back pain-we will provide her with a muscle relaxer, follow-up with her primary care doctor for further evaluation and strict return precautions.            Final Clinical Impression(s) / ED Diagnoses Final diagnoses:  Acute midline low back pain with right-sided sciatica    Rx / DC Orders ED Discharge Orders          Ordered    cyclobenzaprine (FLEXERIL) 10 MG tablet  2 times daily PRN        04/16/22 0237    predniSONE (DELTASONE) 10 MG tablet  Daily        04/16/22 0238              Carroll Sage, PA-C 04/16/22 0238    Dione Booze, MD 04/16/22 [phone]

## 2022-04-16 NOTE — ED Notes (Signed)
Attempted to obtain urine, pt stated she does not need to go right now. Pt stated she would let staff know when she has to urinate.

## 2022-04-16 NOTE — Discharge Instructions (Signed)
You have been seen here for back pain, I recommend taking over-the-counter pain medications like ibuprofen and/or Tylenol every 6 as needed.  Please follow dosage and on the back of bottle.  I also recommend applying heat to the area and stretching out the muscles as this will help decrease stiffness and pain.  Given you a muscle relaxer take as prescribed this medication can make you drowsy do not consume alcohol or operate heavy she will take this medication, have also given you steroids please take as prescribed.  Follow-up with community health and wellness and/or neurosurgery for further evaluation  Come back to the emergency department if you develop chest pain, shortness of breath, severe abdominal pain, uncontrolled nausea, vomiting, diarrhea.

## 2022-04-19 ENCOUNTER — Other Ambulatory Visit: Payer: Self-pay

## 2022-04-19 ENCOUNTER — Emergency Department (HOSPITAL_COMMUNITY)
Admission: EM | Admit: 2022-04-19 | Discharge: 2022-04-19 | Disposition: A | Discharge status: Home / Self Care | Payer: Medicare Other | Source: Home / Self Care | Attending: Emergency Medicine | Admitting: Emergency Medicine

## 2022-04-19 ENCOUNTER — Encounter (HOSPITAL_COMMUNITY): Payer: Self-pay

## 2022-04-19 ENCOUNTER — Emergency Department (HOSPITAL_COMMUNITY)
Admission: EM | Admit: 2022-04-19 | Discharge: 2022-04-19 | Disposition: A | Discharge status: Home / Self Care | Payer: Medicare Other | Attending: Emergency Medicine | Admitting: Emergency Medicine

## 2022-04-19 DIAGNOSIS — E876 Hypokalemia: Secondary | ICD-10-CM | POA: Insufficient documentation

## 2022-04-19 DIAGNOSIS — Z7689 Persons encountering health services in other specified circumstances: Secondary | ICD-10-CM | POA: Insufficient documentation

## 2022-04-19 DIAGNOSIS — T5991XA Toxic effect of unspecified gases, fumes and vapors, accidental (unintentional), initial encounter: Secondary | ICD-10-CM | POA: Insufficient documentation

## 2022-04-19 DIAGNOSIS — R059 Cough, unspecified: Secondary | ICD-10-CM | POA: Diagnosis present

## 2022-04-19 DIAGNOSIS — Z77098 Contact with and (suspected) exposure to other hazardous, chiefly nonmedicinal, chemicals: Secondary | ICD-10-CM | POA: Diagnosis not present

## 2022-04-19 DIAGNOSIS — Z7729 Contact with and (suspected ) exposure to other hazardous substances: Secondary | ICD-10-CM

## 2022-04-19 HISTORY — DX: Sciatica, unspecified side: M54.30

## 2022-04-19 HISTORY — DX: Bronchitis, not specified as acute or chronic: J40

## 2022-04-19 LAB — CBC WITH DIFFERENTIAL/PLATELET
Abs Immature Granulocytes: 0.01 10*3/uL (ref 0.00–0.07)
Basophils Absolute: 0.1 10*3/uL (ref 0.0–0.1)
Basophils Relative: 1 %
Eosinophils Absolute: 0.1 10*3/uL (ref 0.0–0.5)
Eosinophils Relative: 3 %
HCT: 40.1 % (ref 36.0–46.0)
Hemoglobin: 13.1 g/dL (ref 12.0–15.0)
Immature Granulocytes: 0 %
Lymphocytes Relative: 34 %
Lymphs Abs: 1.4 10*3/uL (ref 0.7–4.0)
MCH: 28.7 pg (ref 26.0–34.0)
MCHC: 32.7 g/dL (ref 30.0–36.0)
MCV: 87.9 fL (ref 80.0–100.0)
Monocytes Absolute: 0.4 10*3/uL (ref 0.1–1.0)
Monocytes Relative: 10 %
Neutro Abs: 2.1 10*3/uL (ref 1.7–7.7)
Neutrophils Relative %: 52 %
Platelets: 275 10*3/uL (ref 150–400)
RBC: 4.56 MIL/uL (ref 3.87–5.11)
RDW: 12.9 % (ref 11.5–15.5)
WBC: 4.2 10*3/uL (ref 4.0–10.5)
nRBC: 0 % (ref 0.0–0.2)

## 2022-04-19 LAB — COMPREHENSIVE METABOLIC PANEL
ALT: 18 U/L (ref 0–44)
AST: 22 U/L (ref 15–41)
Albumin: 3.8 g/dL (ref 3.5–5.0)
Alkaline Phosphatase: 65 U/L (ref 38–126)
Anion gap: 6 (ref 5–15)
BUN: 13 mg/dL (ref 8–23)
CO2: 24 mmol/L (ref 22–32)
Calcium: 9.2 mg/dL (ref 8.9–10.3)
Chloride: 109 mmol/L (ref 98–111)
Creatinine, Ser: 0.87 mg/dL (ref 0.44–1.00)
GFR, Estimated: >60 mL/min (ref >60)
Glucose, Bld: 127 mg/dL — ABNORMAL HIGH (ref 70–99)
Potassium: 3.4 mmol/L — ABNORMAL LOW (ref 3.5–5.1)
Sodium: 139 mmol/L (ref 135–145)
Total Bilirubin: 0.7 mg/dL (ref 0.3–1.2)
Total Protein: 6.9 g/dL (ref 6.5–8.1)

## 2022-04-19 NOTE — ED Provider Notes (Signed)
[facility]-EMERGENCY DEPT Provider Note   CSN: [id number] Arrival date & time: 04/19/22  0124     History  Chief Complaint  Patient presents with   Cough   Toxic Inhalation    Holly Lutz is a 61 y.o. female.  Patient presenting with concern for carbon monoxide poisoning.  She reports that she has been in a house with poor ventilation since 6 PM last night.  She says she started noticing that the propane heater was leaking.  She reports developing a headache since then as well as a worsening cough.  She denies any LOC, confusion nausea, vomiting, chest pain.    Cough Associated symptoms: headaches        Home Medications Prior to Admission medications   Medication Sig Start Date End Date Taking? Authorizing Provider  amoxicillin-clavulanate (AUGMENTIN) 875-125 MG tablet Take 1 tablet by mouth every 12 (twelve) hours for 5 days. 04/14/22 04/19/22  Elain Wixon, Finis Bud, PA-C  cyclobenzaprine (FLEXERIL) 10 MG tablet Take 1 tablet (10 mg total) by mouth 2 (two) times daily as needed for muscle spasms. 04/16/22   Carroll Sage, PA-C  predniSONE (DELTASONE) 10 MG tablet Take 3 tablets (30 mg total) by mouth daily for 5 days. 04/16/22 04/21/22  Carroll Sage, PA-C      Allergies    Patient has no allergy information on record.    Review of Systems   Review of Systems  Respiratory:  Positive for cough.   Neurological:  Positive for headaches.  All other systems reviewed and are negative.   Physical Exam Updated Vital Signs BP (!) 145/81   Pulse 80   Temp 98.1 F (36.7 C) (Oral)   Resp 15   Ht 5\' 5"  (1.651 m)   Wt 99.8 kg   SpO2 94%   BMI 36.61 kg/m  Physical Exam Vitals and nursing note reviewed.  Constitutional:      General: She is not in acute distress.    Appearance: Normal appearance. She is not ill-appearing, toxic-appearing or diaphoretic.  HENT:     Head: Normocephalic and atraumatic.     Nose: No nasal deformity.      Mouth/Throat:     Lips: Pink. No lesions.     Mouth: Mucous membranes are moist. No injury, lacerations, oral lesions or angioedema.     Pharynx: Oropharynx is clear. Uvula midline. No pharyngeal swelling, oropharyngeal exudate, posterior oropharyngeal erythema or uvula swelling.  Eyes:     General: Gaze aligned appropriately. No scleral icterus.       Right eye: No discharge.        Left eye: No discharge.     Conjunctiva/sclera: Conjunctivae normal.     Right eye: Right conjunctiva is not injected. No exudate or hemorrhage.    Left eye: Left conjunctiva is not injected. No exudate or hemorrhage.    Pupils: Pupils are equal, round, and reactive to light.  Cardiovascular:     Rate and Rhythm: Normal rate and regular rhythm.     Pulses: Normal pulses.          Radial pulses are 2+ on the right side and 2+ on the left side.       Dorsalis pedis pulses are 2+ on the right side and 2+ on the left side.     Heart sounds: Normal heart sounds, S1 normal and S2 normal. Heart sounds not distant. No murmur heard.    No friction rub. No gallop. No S3 or S4  sounds.  Pulmonary:     Effort: Pulmonary effort is normal. No accessory muscle usage or respiratory distress.     Breath sounds: Normal breath sounds. No stridor. No wheezing, rhonchi or rales.  Chest:     Chest wall: No tenderness.  Abdominal:     General: Abdomen is flat. There is no distension.     Palpations: Abdomen is soft. There is no mass or pulsatile mass.     Tenderness: There is no abdominal tenderness. There is no guarding or rebound.  Musculoskeletal:     Right lower leg: No edema.     Left lower leg: No edema.  Skin:    General: Skin is warm and dry.     Coloration: Skin is not jaundiced or pale.     Findings: No bruising, erythema, lesion or rash.  Neurological:     General: No focal deficit present.     Mental Status: She is alert and oriented to person, place, and time.     GCS: GCS eye subscore is 4. GCS verbal  subscore is 5. GCS motor subscore is 6.  Psychiatric:        Mood and Affect: Mood normal.        Behavior: Behavior normal. Behavior is cooperative.     ED Results / Procedures / Treatments   Labs (all labs ordered are listed, but only abnormal results are displayed) Labs Reviewed - No data to display  EKG None  Radiology No results found.  Procedures Procedures  This patient was on telemetry or cardiac monitoring during their time in the ED.    Medications Ordered in ED Medications - No data to display  ED Course/ Medical Decision Making/ A&P Clinical Course as of 04/19/22 0628  Thu Apr 19, 2022  2376 Patient refusing all labs and just wants to be treated for possible CO poisoning. Since she is mildly symptomatic, will observe for 3-4 hours with O2.  [GL]    Clinical Course User Index [GL] Miyana Mordecai, Finis Bud, PA-C                           Medical Decision Making  Patient here with exposure to CO over the past 12 hours. She only has a mild headache here. Oxygenation is 94% on RA. She refused lab testing to confirm CO toxicity. Will ppx treat with 100% oxygen. She has been observed with this for 2-3 hours. No worsening of symptoms. I do not feel she is in need of admission or further workup at this time. She is stable for discharge.   Final Clinical Impression(s) / ED Diagnoses Final diagnoses:  Carbon monoxide exposure    Rx / DC Orders ED Discharge Orders     None         Claudie Leach, PA-C 04/19/22 2831    Dione Booze, MD 04/19/22 [phone]

## 2022-04-19 NOTE — ED Provider Notes (Signed)
[facility]-EMERGENCY DEPT Provider Note   CSN: [id number] Arrival date & time: 04/19/22  5631     History  Chief Complaint  Patient presents with   Toxic Inhalation    Holly Lutz is a 61 y.o. female.  61 year old female to the ED wanting to speak to social work.  Patient reports she suffered an O2 poisoning.  She did report she had a headache.  Patient was evaluated approximately 6 hours prior to her second visit to the ED in 7 hours.  She is only requesting resources at this time.  She did speak to social work and wanted to "I feel like I can't get my life together". She is currently not having any acute symptoms or signs that require acute workup.   The history is provided by the patient and medical records.       Home Medications Prior to Admission medications   Medication Sig Start Date End Date Taking? Authorizing Provider  amoxicillin-clavulanate (AUGMENTIN) 875-125 MG tablet Take 1 tablet by mouth every 12 (twelve) hours for 5 days. 04/14/22 04/19/22  Loeffler, Finis Bud, PA-C  cyclobenzaprine (FLEXERIL) 10 MG tablet Take 1 tablet (10 mg total) by mouth 2 (two) times daily as needed for muscle spasms. 04/16/22   Carroll Sage, PA-C  predniSONE (DELTASONE) 10 MG tablet Take 3 tablets (30 mg total) by mouth daily for 5 days. 04/16/22 04/21/22  Carroll Sage, PA-C      Allergies    Patient has no allergy information on record.    Review of Systems   Review of Systems  Constitutional:  Negative for chills and fever.  Respiratory:  Negative for shortness of breath.   Cardiovascular:  Negative for chest pain.  Gastrointestinal:  Negative for abdominal pain, nausea and vomiting.  Genitourinary:  Negative for flank pain.  Musculoskeletal:  Negative for back pain.  Skin:  Positive for wound.    Physical Exam Updated Vital Signs BP 136/72 (BP Location: Right Arm)   Pulse 76   Temp 98 F (36.7 C) (Oral)   Resp 19   SpO2 97%   Physical Exam Vitals and nursing note reviewed.  Constitutional:      General: She is not in acute distress.    Appearance: She is well-developed.  HENT:     Head: Normocephalic and atraumatic.     Mouth/Throat:     Pharynx: No oropharyngeal exudate.  Eyes:     Pupils: Pupils are equal, round, and reactive to light.  Cardiovascular:     Rate and Rhythm: Regular rhythm.     Heart sounds: Normal heart sounds.  Pulmonary:     Effort: Pulmonary effort is normal. No respiratory distress.     Breath sounds: Normal breath sounds.  Abdominal:     General: Bowel sounds are normal. There is no distension.     Palpations: Abdomen is soft.     Tenderness: There is no abdominal tenderness.  Musculoskeletal:        General: No tenderness or deformity.     Cervical back: Normal range of motion.     Right lower leg: No edema.     Left lower leg: No edema.  Skin:    General: Skin is warm and dry.  Neurological:     Mental Status: She is alert and oriented to person, place, and time.     ED Results / Procedures / Treatments   Labs (all labs ordered are listed, but only abnormal results are  displayed) Labs Reviewed  COMPREHENSIVE METABOLIC PANEL - Abnormal; Notable for the following components:      Result Value   Potassium 3.4 (*)    Glucose, Bld 127 (*)    All other components within normal limits  CBC WITH DIFFERENTIAL/PLATELET  URINALYSIS, ROUTINE W REFLEX MICROSCOPIC    EKG None  Radiology No results found.  Procedures Procedures    Medications Ordered in ED Medications - No data to display  ED Course/ Medical Decision Making/ A&P                           Medical Decision Making Amount and/or Complexity of Data Reviewed Labs: ordered.    Patient here for second visit in the past 7 hours, she is requesting to speak to social work in order to obtain resources.  Patient appears in no distress on the phone with social work, after finding out that she could not get  placed to a SNF, could not have placement for a shelter she now is complaining of dizziness, she is very vague about her symptoms.  Patient walked into the emergency department with a steady gait without any goal weakness is noted.  Now patient is requesting blood work as she refuses blood work 6 hours ago.  She was offered a bus pass but reports a busses are not running today.  She was offered safe transport however she reports now she cannot walk.  I do have some suspicion for malingering at this time as patient is hemodynamically stable with stable vital signs no tachycardia, no hypoxia and stable blood pressure.  Extensive chart review reveals blood work from a couple of days ago which shows a stable electrolytes, slight decrease in potassium.  However, overall stable.  She is requesting labs at this time.  CBC without any leukocytosis, hemoglobin is stable without any signs of anemia.  CMP still with slight decrease in potassium however this is stable, she is not vomiting, no diarrhea.  Will not be replacing this on today's visit.  She was asked for urine sample however patient continues to sleep in the recliner.  She ambulated here adequately I do not feel that patient requires any acute workup at this time, she originally checked in requesting social work and a shelter.  Suspicion for malingering at this time, patient discharged in stable condition from the emergency department.   Portions of this note were generated with Scientist, clinical (histocompatibility and immunogenetics). Dictation errors may occur despite best attempts at proofreading.   Final Clinical Impression(s) / ED Diagnoses Final diagnoses:  Encounter for social work intervention    Rx / DC Orders ED Discharge Orders     None         Claude Manges, [place] 04/19/22 3299    Rolan Bucco, MD 04/19/22 1101

## 2022-04-19 NOTE — ED Triage Notes (Signed)
Patient in today reporting cough from propane heater with no ventilation. Seen earlier this morning and is requesting to speak with social worker for resources.

## 2022-04-19 NOTE — ED Notes (Signed)
Called safe transport to take pt to the [facility].  Pt agrees to go to this community center for further assistance about living conditions.  Safe transport arrived and pt ambulated to the restroom prior to going out to leave.

## 2022-04-19 NOTE — Discharge Instructions (Addendum)
Your laboratories also are within normal limits today.  Up with your primary care physician as needed.

## 2022-04-19 NOTE — Discharge Instructions (Signed)
You were seen in the ED today after exposure to carbon monoxide. We gave you Oxygen while you were here. No further interventions are needed today. You should have your heater evaluated by a specialist prior to using it again to avoid harm from exposure.

## 2022-04-19 NOTE — ED Notes (Signed)
Jada with SW is on the phone with this pt

## 2022-04-19 NOTE — Progress Notes (Addendum)
Transition of Care [facility]) - Emergency Department Mini Assessment   Patient Details  Name: Holly Lutz MRN: [id number] Date of Birth: Jan 18, 1961  [facility] CM/SW Contact:    Princella Ion, LCSW Phone Number: 04/19/2022, 7:57 AM   Clinical Narrative: [facility] consulted for HH/DME; however, RN notes that pt is requesting to speak with SW. This CSW spoke with the pt who states she has nowhere to go and mentions dizziness/body aches. This CSW inquired about if these symptoms were told to the provider. This CSW reviewed the providers note that informs there is no current need for admission or further medical work up. This CSW also confirmed with the provider via secure chat that symptoms were noted. The pt appears to be malingering. This CSW informed the pt that she is not eligible for SNF placement due to having Medicare A/B without the waiver and no 3 night IP stay. HH/DME is not an option due to the pt experiencing homelessness. Pt can receive bus passes to [facility] who is open today. TOC signing off.    ED Mini Assessment: What brought you to the Emergency Department? : cough  Barriers to Discharge: No Barriers Identified     Means of departure: Not know       Patient Contact and Communications Key Contact 1: Patient   Spoke with: Patient Contact Date: 04/19/22,   Contact time: 0744             Admission diagnosis:  social worker There are no problems to display for this patient.  PCP:  Patient, No Pcp Per Pharmacy:  No Pharmacies Listed

## 2022-04-19 NOTE — ED Triage Notes (Addendum)
Pt BIB EMS with a cough. Pt states that she has been in close proximity to a propane heater with little to no ventilation. Pt is also complaining of a headache.

## 2022-04-19 NOTE — Progress Notes (Signed)
Pt refused arterial carboxyhemoglobin stick MD made aware.

## 2022-04-19 NOTE — ED Notes (Addendum)
Gave pt cup for urine and directed her to bathrooms but she hasn't moved from recliner

## 2022-04-23 ENCOUNTER — Emergency Department (HOSPITAL_COMMUNITY): Payer: Medicare Other

## 2022-04-23 ENCOUNTER — Emergency Department (HOSPITAL_COMMUNITY)
Admission: EM | Admit: 2022-04-23 | Discharge: 2022-04-23 | Disposition: A | Discharge status: Home / Self Care | Payer: Medicare Other | Attending: Emergency Medicine | Admitting: Emergency Medicine

## 2022-04-23 ENCOUNTER — Other Ambulatory Visit: Payer: Self-pay

## 2022-04-23 ENCOUNTER — Encounter (HOSPITAL_COMMUNITY): Payer: Self-pay

## 2022-04-23 DIAGNOSIS — Z760 Encounter for issue of repeat prescription: Secondary | ICD-10-CM | POA: Insufficient documentation

## 2022-04-23 DIAGNOSIS — W010XXA Fall on same level from slipping, tripping and stumbling without subsequent striking against object, initial encounter: Secondary | ICD-10-CM | POA: Diagnosis not present

## 2022-04-23 DIAGNOSIS — M25512 Pain in left shoulder: Secondary | ICD-10-CM | POA: Insufficient documentation

## 2022-04-23 DIAGNOSIS — Y9301 Activity, walking, marching and hiking: Secondary | ICD-10-CM | POA: Diagnosis not present

## 2022-04-23 DIAGNOSIS — W19XXXA Unspecified fall, initial encounter: Secondary | ICD-10-CM

## 2022-04-23 DIAGNOSIS — R0781 Pleurodynia: Secondary | ICD-10-CM | POA: Diagnosis not present

## 2022-04-23 MED ORDER — ACETAMINOPHEN 500 MG PO TABS: 1000.0000 mg | ORAL_TABLET | Freq: Once | ORAL | Status: DC

## 2022-04-23 MED ORDER — ALBUTEROL SULFATE HFA 108 (90 BASE) MCG/ACT IN AERS: 1.0000 | INHALATION_SPRAY | Freq: Four times a day (QID) | RESPIRATORY_TRACT | 0 refills | Status: AC | PRN

## 2022-04-23 NOTE — ED Provider Notes (Signed)
[facility] EMERGENCY DEPARTMENT Provider Note   CSN: [id number] Arrival date & time: 04/23/22  0044     History  Chief Complaint  Patient presents with   Marletta Lor    Holly Lutz is a 61 y.o. female.   Fall  The patient is a 61 year old female with past medical history of chronic back pain, presenting for evaluation after a fall.  The patient states that she was walking across a bridge last night when she stepped in a hole, falling landing on her left shoulder.  She denies head impact or loss of consciousness.  She states that she has had left shoulder pain and left rib pain since the accident.  She denies dizziness, nausea, vomiting, weakness.  She has no recent infectious symptoms or additional medical concerns at this time.  The patient is requesting a refill on her albuterol prescription.     Home Medications Prior to Admission medications   Medication Sig Start Date End Date Taking? Authorizing Provider  albuterol (VENTOLIN HFA) 108 (90 Base) MCG/ACT inhaler Inhale 1-2 puffs into the lungs every 6 (six) hours as needed for wheezing or shortness of breath. 04/23/22 05/23/22 Yes Knox Saliva, MD  cyclobenzaprine (FLEXERIL) 10 MG tablet Take 1 tablet (10 mg total) by mouth 2 (two) times daily as needed for muscle spasms. 04/16/22   Carroll Sage, PA-C      Allergies    Patient has no known allergies.    Review of Systems   Review of Systems  See HPI  Physical Exam Updated Vital Signs BP 130/70 (BP Location: Right Arm)   Pulse 78   Temp 98 F (36.7 C) (Oral)   Resp 15   Ht 5\' 5"  (1.651 m)   Wt 99.8 kg   SpO2 98%   BMI 36.61 kg/m  Physical Exam Vitals and nursing note reviewed.  Constitutional:      General: She is not in acute distress.    Appearance: She is well-developed.  HENT:     Head: Normocephalic and atraumatic.  Eyes:     Conjunctiva/sclera: Conjunctivae normal.  Cardiovascular:     Rate and Rhythm: Normal rate and  regular rhythm.     Heart sounds: No murmur heard. Pulmonary:     Effort: Pulmonary effort is normal. No respiratory distress.     Breath sounds: Normal breath sounds.  Abdominal:     Palpations: Abdomen is soft.     Tenderness: There is no abdominal tenderness.  Musculoskeletal:        General: No swelling.     Cervical back: Neck supple.  Skin:    General: Skin is warm and dry.     Capillary Refill: Capillary refill takes less than 2 seconds.  Neurological:     Mental Status: She is alert.  Psychiatric:        Mood and Affect: Mood normal.     ED Results / Procedures / Treatments   Labs (all labs ordered are listed, but only abnormal results are displayed) Labs Reviewed - No data to display  EKG None  Radiology DG Shoulder 1V Left  Result Date: 04/23/2022 CLINICAL DATA:  Status post fall, left shoulder pain EXAM: LEFT SHOULDER COMPARISON:  None Available. FINDINGS: No acute fracture or dislocation. No aggressive osseous lesion. Normal alignment. Soft tissue are unremarkable. No radiopaque foreign body or soft tissue emphysema. IMPRESSION: 1. No acute osseous injury of the left shoulder. Electronically Signed   By: 04/25/2022.D.  On: 04/23/2022 08:50   DG Ribs Unilateral W/Chest Left  Result Date: 04/23/2022 CLINICAL DATA:  Fall, rib pain on left side under breast EXAM: LEFT RIBS AND CHEST - 3 VIEW COMPARISON:  04/14/2022 chest radiograph FINDINGS: No displaced fracture or other bone lesions are seen involving the ribs. There is no evidence of pneumothorax or pleural effusion. Both lungs are clear. Heart size and mediastinal contours are within normal limits. IMPRESSION: Negative. Electronically Signed   By: Wiliam Ke M.D.   On: 04/23/2022 01:42    Procedures Procedures    Medications Ordered in ED Medications  acetaminophen (TYLENOL) tablet 1,000 mg (has no administration in time range)    ED Course/ Medical Decision Making/ A&P                            Medical Decision Making The patient is a 61 year old female with past medical history of chronic back pain presenting for evaluation after mechanical fall with left shoulder pain.  The differential diagnosis considered includes: Fracture, dislocation, contusion, abrasion.  On initial evaluation, the patient is hemodynamically stable.  On physical exam there were no visible signs of injury and the patient was noted to have full active range of motion in all 4 extremities.  The patient's diagnostic workup included a chest x-ray which did not show any sign of rib fracture, pneumothorax, or other cardiopulmonary abnormality.  She also received a dedicated left shoulder x-ray which showed no signs of fracture or dislocation.  Based on the patient's history, physical exam without signs of contusion, abrasion, or physical injury with noted full range active and passive motion of all 4 extremities, and negative diagnostic imaging I doubt fracture or dislocation at this time.  While in the emergency department, the patient was given a dose of Tylenol and her albuterol prescription was refilled.  The patient was discharged with instructions to follow-up with her PCP in 24-48 hours for reevaluation and given strict return precautions to the emergency department.  Amount and/or Complexity of Data Reviewed External Data Reviewed: labs and notes. Radiology: ordered.  Risk OTC drugs. Prescription drug management.   Patient's presentation is most consistent with acute complicated illness / injury requiring diagnostic workup.         Final Clinical Impression(s) / ED Diagnoses Final diagnoses:  Fall, initial encounter    Rx / DC Orders ED Discharge Orders          Ordered    albuterol (VENTOLIN HFA) 108 (90 Base) MCG/ACT inhaler  Every 6 hours PRN        04/23/22 0930              Knox Saliva, MD 04/23/22 0347    Pricilla Loveless, MD 05/01/22 [phone]

## 2022-04-23 NOTE — ED Provider Triage Note (Signed)
Emergency Medicine Provider Triage Evaluation Note  Holly Lutz , a 61 y.o. female  was evaluated in triage.  Pt complains of left rib pain.  States she was walking across a bridge today when she lost her footing and fell onto left side.  She denies any head injury or loss of consciousness.  She reports ongoing pain in the left rib.  Also reports during last visit a few days ago she forgot to ask for albuterol inhaler and would like that now.  Review of Systems  Positive: Fall, rib pain, medication refill Negative: Vomiting, SOB  Physical Exam  BP (!) 147/80   Pulse 87   Temp 98.3 F (36.8 C) (Oral)   Resp 16   SpO2 94%  Gen:   Awake, no distress   Resp:  Normal effort  MSK:   Moves extremities without difficulty  Other:  No bruising or deformity noted to chest wall, tender along anterior upper/mid ribs, lungs clear, no distress  Medical Decision Making  Medically screening exam initiated at 1:16 AM.  Appropriate orders placed.  Holly Lutz was informed that the remainder of the evaluation will be completed by another provider, this initial triage assessment does not replace that evaluation, and the importance of remaining in the ED until their evaluation is complete.  Fall, rib pain, medication refill.  No external signs of trauma.  Rib films ordered.     Garlon Hatchet, PA-C 04/23/22 0117

## 2022-04-23 NOTE — ED Notes (Signed)
Discharge instructions reviewed with patient. Patient denies any questions or concerns. Patient out to lobby via wheelchair. 

## 2022-04-23 NOTE — ED Triage Notes (Signed)
Pt reports she tripped and fell tonight when walking down the road. She fell onto her left side. Denies head injury, no LOC. She reports pain to the left side of abdomen.  Pt also reports she would like her albuterol inhaler prescription refilled.

## 2022-08-25 ENCOUNTER — Encounter (HOSPITAL_COMMUNITY): Payer: Self-pay | Admitting: Emergency Medicine

## 2022-08-25 ENCOUNTER — Emergency Department (HOSPITAL_COMMUNITY)
Admission: EM | Admit: 2022-08-25 | Discharge: 2022-08-25 | Disposition: A | Discharge status: Home / Self Care | Payer: Medicare Other | Attending: Emergency Medicine | Admitting: Emergency Medicine

## 2022-08-25 DIAGNOSIS — E876 Hypokalemia: Secondary | ICD-10-CM | POA: Insufficient documentation

## 2022-08-25 DIAGNOSIS — R531 Weakness: Secondary | ICD-10-CM | POA: Diagnosis present

## 2022-08-25 LAB — COMPREHENSIVE METABOLIC PANEL
ALT: 18 U/L (ref 0–44)
AST: 28 U/L (ref 15–41)
Albumin: 3.5 g/dL (ref 3.5–5.0)
Alkaline Phosphatase: 83 U/L (ref 38–126)
Anion gap: 11 (ref 5–15)
BUN: 12 mg/dL (ref 8–23)
CO2: 21 mmol/L — ABNORMAL LOW (ref 22–32)
Calcium: 9.4 mg/dL (ref 8.9–10.3)
Chloride: 107 mmol/L (ref 98–111)
Creatinine, Ser: 0.97 mg/dL (ref 0.44–1.00)
GFR, Estimated: >60 mL/min (ref >60)
Glucose, Bld: 175 mg/dL — ABNORMAL HIGH (ref 70–99)
Potassium: 3.3 mmol/L — ABNORMAL LOW (ref 3.5–5.1)
Sodium: 139 mmol/L (ref 135–145)
Total Bilirubin: 0.6 mg/dL (ref 0.3–1.2)
Total Protein: 6.4 g/dL — ABNORMAL LOW (ref 6.5–8.1)

## 2022-08-25 LAB — CBC WITH DIFFERENTIAL/PLATELET
Abs Immature Granulocytes: 0.01 10*3/uL (ref 0.00–0.07)
Basophils Absolute: 0.1 10*3/uL (ref 0.0–0.1)
Basophils Relative: 1 %
Eosinophils Absolute: 0.1 10*3/uL (ref 0.0–0.5)
Eosinophils Relative: 2 %
HCT: 39.4 % (ref 36.0–46.0)
Hemoglobin: 13.1 g/dL (ref 12.0–15.0)
Immature Granulocytes: 0 %
Lymphocytes Relative: 36 %
Lymphs Abs: 1.8 10*3/uL (ref 0.7–4.0)
MCH: 29 pg (ref 26.0–34.0)
MCHC: 33.2 g/dL (ref 30.0–36.0)
MCV: 87.4 fL (ref 80.0–100.0)
Monocytes Absolute: 0.4 10*3/uL (ref 0.1–1.0)
Monocytes Relative: 7 %
Neutro Abs: 2.8 10*3/uL (ref 1.7–7.7)
Neutrophils Relative %: 54 %
Platelets: 262 10*3/uL (ref 150–400)
RBC: 4.51 MIL/uL (ref 3.87–5.11)
RDW: 12.8 % (ref 11.5–15.5)
WBC: 5.1 10*3/uL (ref 4.0–10.5)
nRBC: 0 % (ref 0.0–0.2)

## 2022-08-25 LAB — URINALYSIS, ROUTINE W REFLEX MICROSCOPIC
Bilirubin Urine: NEGATIVE
Glucose, UA: NEGATIVE mg/dL
Hgb urine dipstick: NEGATIVE
Ketones, ur: NEGATIVE mg/dL
Leukocytes,Ua: NEGATIVE
Nitrite: NEGATIVE
Protein, ur: NEGATIVE mg/dL
Specific Gravity, Urine: 1.028 (ref 1.005–1.030)
pH: 5 (ref 5.0–8.0)

## 2022-08-25 LAB — CK: Total CK: 174 U/L (ref 38–234)

## 2022-08-25 LAB — MAGNESIUM: Magnesium: 2 mg/dL (ref 1.7–2.4)

## 2022-08-25 MED ORDER — POTASSIUM CHLORIDE ER 10 MEQ PO TBCR: 10.0000 meq | EXTENDED_RELEASE_TABLET | Freq: Every day | ORAL | 0 refills | Status: AC

## 2022-08-25 NOTE — Discharge Instructions (Addendum)
Your potassium was slightly low today, starting you on a potassium pill please take as prescribed.  Please follow-up with community health and wellness for reassessment.  Come back to the emergency department if you develop chest pain, shortness of breath, severe abdominal pain, uncontrolled nausea, vomiting, diarrhea.

## 2022-08-25 NOTE — ED Provider Triage Note (Signed)
Emergency Medicine Provider Triage Evaluation Note  Holly Lutz , a 62 y.o. female  was evaluated in triage.  Pt complains of muscle soreness and weak feeling in her legs.  Ongoing for quite some time now without known cause.  States her strength is just not coming back.  Denies new injury/trauma/falls.  She has been prescribed medications and has started PT but does not seem to be improving.  Admits to having multiple imaging studies in the past without answer, she just wants to know what is wrong.  No incontinence.    Review of Systems  Positive: Muscle pain, leg weakness Negative: fever  Physical Exam  BP (!) 152/76 (BP Location: Left Arm)   Pulse 89   Temp 98.2 F (36.8 C) (Oral)   Resp 20   SpO2 97%  Gen:   Awake, no distress   Resp:  Normal effort  MSK:   Moves extremities without difficulty  Other:  Legs propped up on stool during exam  Medical Decision Making  Medically screening exam initiated at 1:14 AM.  Appropriate orders placed.  Holly Lutz was informed that the remainder of the evaluation will be completed by another provider, this initial triage assessment does not replace that evaluation, and the importance of remaining in the ED until their evaluation is complete.  Muscle soreness and weakness in the legs.  Ongoing for quite some time now.   No new trauma/falls.  No incontinence.  Will check labs.   Larene Pickett, PA-C 08/25/22 [phone]

## 2022-08-25 NOTE — ED Triage Notes (Addendum)
Pt reports muscle weakness for weeks. Generalized weakness. Denies chest pain or shortness of breath

## 2022-08-25 NOTE — ED Provider Notes (Signed)
[place] Provider Note   CSN: [id number] Arrival date & time: 08/25/22  0037     History  Chief Complaint  Patient presents with   Weakness    Holly Lutz is a 62 y.o. female.  HPI   Patient without significant medical history presented with complaints of generalized muscle weakness in her lower legs, states been going on since Thanksgiving, states that when she walks she feels like her muscles are tugging her, she also notes that she has pain, in her legs, she denies any paresthesias, no saddle paresthesias no urinary or bowel incontinency's, she denies any back pain no recent falls or trauma.  She denies paresthesia or weakness in the upper/lower extremities no headaches no change in vision, no recent head trauma, she is not on anticoag's.  She states that she fell like this when her potassium was low, she does not endorse any chest pain shortness of breath she has no complaints.  Home Medications Prior to Admission medications   Medication Sig Start Date End Date Taking? Authorizing Provider  potassium chloride (KLOR-CON) 10 MEQ tablet Take 1 tablet (10 mEq total) by mouth daily for 5 days. 08/25/22 08/30/22 Yes Marcello Fennel, PA-C  albuterol (VENTOLIN HFA) 108 (90 Base) MCG/ACT inhaler Inhale 1-2 puffs into the lungs every 6 (six) hours as needed for wheezing or shortness of breath. 04/23/22 05/23/22  Dani Gobble, MD  cyclobenzaprine (FLEXERIL) 10 MG tablet Take 1 tablet (10 mg total) by mouth 2 (two) times daily as needed for muscle spasms. 04/16/22   Marcello Fennel, PA-C      Allergies    Patient has no known allergies.    Review of Systems   Review of Systems  Constitutional:  Negative for chills and fever.  Respiratory:  Negative for shortness of breath.   Cardiovascular:  Negative for chest pain.  Gastrointestinal:  Negative for abdominal pain.  Neurological:  Positive for weakness. Negative for headaches.     Physical Exam Updated Vital Signs BP 136/71   Pulse 68   Temp 98.3 F (36.8 C)   Resp 19   SpO2 98%  Physical Exam Vitals and nursing note reviewed.  Constitutional:      General: She is not in acute distress.    Appearance: She is not ill-appearing.  HENT:     Head: Normocephalic and atraumatic.     Nose: No congestion.  Eyes:     Conjunctiva/sclera: Conjunctivae normal.  Cardiovascular:     Rate and Rhythm: Normal rate and regular rhythm.     Pulses: Normal pulses.     Heart sounds: No murmur heard.    No friction rub. No gallop.  Pulmonary:     Effort: No respiratory distress.     Breath sounds: No wheezing, rhonchi or rales.  Abdominal:     Palpations: Abdomen is soft.     Tenderness: There is no abdominal tenderness. There is no right CVA tenderness or left CVA tenderness.  Musculoskeletal:     Comments: Spine was palpated was nontender to palpation no step-off deformities noted, patient is moving her upper and lower extremities out difficulty, she has 5 5 strength, neurovascularly intact in the upper and lower extremities.  She has 2+ dorsal pedal pulses.   Skin:    General: Skin is warm and dry.  Neurological:     Mental Status: She is alert.     GCS: GCS eye subscore is 4. GCS verbal subscore  is 5. GCS motor subscore is 6.     Cranial Nerves: Cranial nerves 2-12 are intact.     Sensory: Sensation is intact.     Motor: No weakness.     Coordination: Romberg sign negative. Finger-Nose-Finger Test normal.     Gait: Gait is intact.     Comments: No facial asymmetry no difficulty with word finding following two-step commands there is no unilateral weakness present, gait fully intact.  Psychiatric:        Mood and Affect: Mood normal.     ED Results / Procedures / Treatments   Labs (all labs ordered are listed, but only abnormal results are displayed) Labs Reviewed  COMPREHENSIVE METABOLIC PANEL - Abnormal; Notable for the following components:      Result  Value   Potassium 3.3 (*)    CO2 21 (*)    Glucose, Bld 175 (*)    Total Protein 6.4 (*)    All other components within normal limits  URINALYSIS, ROUTINE W REFLEX MICROSCOPIC - Abnormal; Notable for the following components:   APPearance HAZY (*)    All other components within normal limits  CBC WITH DIFFERENTIAL/PLATELET  CK  MAGNESIUM    EKG None  Radiology No results found.  Procedures Procedures    Medications Ordered in ED Medications - No data to display  ED Course/ Medical Decision Making/ A&P                             Medical Decision Making Amount and/or Complexity of Data Reviewed Labs: ordered.   This patient presents to the ED for concern of weakness, this involves an extensive number of treatment options, and is a complaint that carries with it a high risk of complications and morbidity.  The differential diagnosis includes spine equina, epidural abscess, CVA, malignancy    Additional history obtained:  Additional history obtained from N/A External records from outside source obtained and reviewed including recent ER notes   Co morbidities that complicate the patient evaluation  N/A  Social Determinants of Health:  N/A    Lab Tests:  I Ordered, and personally interpreted labs.  The pertinent results include: CBC is unremarkable, CMP reveals potassium 3.3, CO2 of 21, glucose 175, total protein 6.4, UA unremarkable, CK174, magnesium 2.0   Imaging Studies ordered:  I ordered imaging studies including N/A I independently visualized and interpreted imaging which showed N/A I agree with the radiologist interpretation   Cardiac Monitoring:  The patient was maintained on a cardiac monitor.  I personally viewed and interpreted the cardiac monitored which showed an underlying rhythm of: N/A   Medicines ordered and prescription drug management:  I ordered medication including N/A I have reviewed the patients home medicines and have made  adjustments as needed  Critical Interventions:  N/A   Reevaluation:  Triage obtain basic lab workup which I personally reviewed, she had a benign physical exam, postvoid was obtained which was 0, patient ambulated difficulty, she is agreement discharge at this time.    Consultations Obtained:  N/a    Test Considered:  CT head-deferred my suspicion for intracranial bleed at this time, no recent falls, not anticoag's.    Rule out Low suspicion for CVA she has no focal deficit present my exam.  Low suspicion for dissection of the vertebral or carotid artery as presentation atypical of etiology.  Low suspicion for meningitis as she has no meningeal sign present. I  have low suspicion for spinal fracture or spinal cord abnormality as patient denies urinary incontinency, retention, difficulty with bowel movements, denies saddle paresthesias.  Spine was palpated there is no step-off, crepitus or gross deformities felt, patient had 5/5 strength, full range of motion, neurovascular fully intact in the lower extremities, postvoid was 0. Low suspicion for septic arthritis as patient denies IV drug use, skin exam was performed no erythematous, edema or warm joints noted.      Dispostion and problem list  After consideration of the diagnostic results and the patients response to treatment, I feel that the patent would benefit from discharge.  Generalized fatigue-unclear etiology possible from low potassium, will start her on potassium supplement, follow-up with her primary doctor for reassessment, strict turn precautions.            Final Clinical Impression(s) / ED Diagnoses Final diagnoses:  Generalized weakness  Hypokalemia    Rx / DC Orders ED Discharge Orders          Ordered    potassium chloride (KLOR-CON) 10 MEQ tablet  Daily        08/25/22 0715              Marcello Fennel, PA-C [id number]    Delora Fuel, MD [id number] [phone]

## 2022-08-25 NOTE — ED Notes (Signed)
Post void residual was 0 ml.

## 2022-09-07 ENCOUNTER — Other Ambulatory Visit: Payer: Self-pay

## 2022-09-07 ENCOUNTER — Encounter (HOSPITAL_COMMUNITY): Payer: Self-pay | Admitting: Emergency Medicine

## 2022-09-07 ENCOUNTER — Ambulatory Visit (HOSPITAL_COMMUNITY): Admission: RE | Admit: 2022-09-07 | Payer: Medicare Other | Source: Ambulatory Visit

## 2022-09-07 ENCOUNTER — Emergency Department (HOSPITAL_COMMUNITY)
Admission: EM | Admit: 2022-09-07 | Discharge: 2022-09-07 | Disposition: A | Discharge status: Home / Self Care | Payer: Medicare Other | Attending: Emergency Medicine | Admitting: Emergency Medicine

## 2022-09-07 DIAGNOSIS — M25552 Pain in left hip: Secondary | ICD-10-CM | POA: Diagnosis not present

## 2022-09-07 DIAGNOSIS — M79604 Pain in right leg: Secondary | ICD-10-CM | POA: Diagnosis present

## 2022-09-07 MED ORDER — NAPROXEN SODIUM 550 MG PO TABS: 550.0000 mg | ORAL_TABLET | Freq: Two times a day (BID) | ORAL | Status: DC

## 2022-09-07 MED ORDER — LIDOCAINE 5 % EX PTCH
1.0000 | MEDICATED_PATCH | CUTANEOUS | Status: DC
  Administered 2022-09-07: 1 via TRANSDERMAL
  Filled 2022-09-07: qty 1

## 2022-09-07 MED ORDER — ENOXAPARIN SODIUM 100 MG/ML IJ SOSY
1.0000 mg/kg | PREFILLED_SYRINGE | Freq: Once | INTRAMUSCULAR | Status: DC
  Filled 2022-09-07: qty 1

## 2022-09-07 MED ORDER — LIDOCAINE 5 % EX PTCH: 1.0000 | MEDICATED_PATCH | CUTANEOUS | 0 refills | Status: AC

## 2022-09-07 MED ORDER — NAPROXEN 500 MG PO TABS
500.0000 mg | ORAL_TABLET | Freq: Two times a day (BID) | ORAL | Status: DC
  Administered 2022-09-07: 500 mg via ORAL
  Filled 2022-09-07: qty 1

## 2022-09-07 NOTE — ED Notes (Signed)
Patient verbalizes understanding of discharge instructions. Opportunity for questioning and answers were provided. Armband removed by staff, pt discharged from ED. Wheeled out to lobby, pt awaiting bus for transport home

## 2022-09-07 NOTE — Discharge Instructions (Addendum)
You are seen today for leg pain and swelling.  I am concerned you could have a blood clot in your right leg.  Since we cannot get an ultrasound right now you will have to go to [facility] tomorrow, instructions are below.    You can take over-the-counter medication as needed for discomfort at home. Come back if you have any new or worsening sympotms.  If tomorrow is a Saturday, Sunday or holiday, please go to the [facility] Emergency Department Registration Desk at 11 am tomorrow morning and tell them you are there for a vascular study.    If tomorrow is a weekday (Monday-Friday), please go to [facility] En[facility] Registration at 11 am and tell them you are there for a vascular study.

## 2022-09-07 NOTE — ED Provider Notes (Addendum)
EMERGENCY DEPARTMENT AT [facility] Provider Note   CSN: [id number] Arrival date & time: 09/07/22  0038     History  Chief Complaint  Patient presents with   Leg Pain    Holly Lutz is a 62 y.o. female.  She has PMH of low back pain with sciatica affecting the right side, resents the ER complaining of sciatica "acting up" and pain in her bilateral legs with some swelling.  She reports that she was on a [employer] bus for total of about 24 hours over 2 to 3 days coming from [place] back to [place] after her mother had passed away and she was cleaning out her mother's belongings.  Pain in the left leg is the left lateral hip with no radiation, no calf swelling or tenderness.  Patient has some chronic sciatica on the right side but reports tenderness to the right calf which is unusual for her.  No history of DVT, no chest pain, no shortness of breath  Leg Pain      Home Medications Prior to Admission medications   Medication Sig Start Date End Date Taking? Authorizing Provider  lidocaine (LIDODERM) 5 % Place 1 patch onto the skin daily. Remove & Discard patch within 12 hours or as directed by MD 09/07/22  Yes Cristi Loron, Freida Nebel A, PA-C  albuterol (VENTOLIN HFA) 108 (90 Base) MCG/ACT inhaler Inhale 1-2 puffs into the lungs every 6 (six) hours as needed for wheezing or shortness of breath. 04/23/22 05/23/22  Knox Saliva, MD  cyclobenzaprine (FLEXERIL) 10 MG tablet Take 1 tablet (10 mg total) by mouth 2 (two) times daily as needed for muscle spasms. 04/16/22   Carroll Sage, PA-C  potassium chloride (KLOR-CON) 10 MEQ tablet Take 1 tablet (10 mEq total) by mouth daily for 5 days. 08/25/22 08/30/22  Carroll Sage, PA-C      Allergies    Patient has no known allergies.    Review of Systems   Review of Systems  Physical Exam Updated Vital Signs BP 126/70 (BP Location: Left Arm)   Pulse 80   Temp 98.3 F (36.8 C) (Oral)   Resp 18   Wt 99.8 kg    SpO2 99%   BMI 36.61 kg/m  Physical Exam Vitals and nursing note reviewed.  Constitutional:      General: She is not in acute distress.    Appearance: She is well-developed. She is obese.  HENT:     Head: Normocephalic and atraumatic.     Mouth/Throat:     Mouth: Mucous membranes are moist.  Eyes:     Conjunctiva/sclera: Conjunctivae normal.  Cardiovascular:     Rate and Rhythm: Normal rate and regular rhythm.     Heart sounds: No murmur heard. Pulmonary:     Effort: Pulmonary effort is normal. No respiratory distress.     Breath sounds: Normal breath sounds.  Abdominal:     General: There is no distension.     Palpations: Abdomen is soft.     Tenderness: There is no abdominal tenderness.  Musculoskeletal:        General: No swelling.     Cervical back: Neck supple.     Comments: Normal range of motion bilateral lower extremities, patient can flex and extend both hips knees and ankles.    Patient has mild pain radiation straight leg raise on right side.  Right calf as no significant edema but has tenderness to palpation.   DP/PT pulses 2+ bilaterally  Tenderness  to left lateral hip no swelling, no limited range of motion, no redness or warmth.  No crepitus.  Skin:    General: Skin is warm and dry.     Capillary Refill: Capillary refill takes less than 2 seconds.  Neurological:     General: No focal deficit present.     Mental Status: She is alert and oriented to person, place, and time.     Gait: Gait normal.  Psychiatric:        Mood and Affect: Mood normal.     ED Results / Procedures / Treatments   Labs (all labs ordered are listed, but only abnormal results are displayed) Labs Reviewed - No data to display  EKG None  Radiology No results found.  Procedures Procedures    Medications Ordered in ED Medications  lidocaine (LIDODERM) 5 % 1 patch (1 patch Transdermal Patch Applied 09/07/22 0152)  naproxen (NAPROSYN) tablet 500 mg (500 mg Oral Given  09/07/22 0153)  enoxaparin (LOVENOX) injection 100 mg (has no administration in time range)    ED Course/ Medical Decision Making/ A&P                             Medical Decision Making DDx: DVT, sciatica, muscle spasm, strain, fracture, other ED course: Well-appearing 62 year old female with history of right-sided sciatica presents ER complaining for right calf pain and left hip pain.  She states she spent about 24 hours on a [employer] bus over couple of days trying to get back home from [place]. She states she does have sciatica but does not normally have tenderness to the calf and was concerned.  No redness warmth or color changes otherwise to the lower extremity, no edema noted but given risk factor of prolonged immobilization patient does need ultrasound rule out DVT.  Ordered DVT study for tomorrow and discussed risk and benefits of anticoagulation.  Adamantly against the empiric anticoagulation.  Discussed is overall very safe given her has bled score of 0 but she states she just does not feel comfortable with this.  We did discuss that if she is a DVT there would be risk of developing pulmonary embolism which could cause significant morbidity mortality.  Patient verbalized understanding and demonstrates good capacity and decision-making.  She is agreeable with getting the ultrasound tomorrow however.  Discussed with patient pain in her left hip likely musculoskeletal.  I offered x-ray but patient states she is able to ambulate, does not think will show anything and has history of arthritis and feels this is likely cause.  Advised follow-up primary care doctor, given strict return precautions.  Risk Prescription drug management.           Final Clinical Impression(s) / ED Diagnoses Final diagnoses:  Right leg pain  Left hip pain    Rx / DC Orders ED Discharge Orders          Ordered    LE Venous       Comments: IMPORTANT PATIENT INSTRUCTIONS:  You have been scheduled  for an Outpatient Vascular Study at [facility].    If tomorrow is a Saturday, Sunday or holiday, please go to the [facility] Emergency Department Registration Desk at 11 am tomorrow morning and tell them you are there for a vascular study.   If tomorrow is a weekday (Monday-Friday), please go to [facility] Entr[facility] Registration at 11 am and tell them  you are there for a vascular study.   09/07/22 0147    lidocaine (LIDODERM) 5 %  Every 24 hours        09/07/22 0155              Ma Rings, PA-C 09/07/22 0207    Ma Rings, PA-C 09/07/22 1610    Shon Baton, MD 09/15/22 [phone]

## 2022-09-07 NOTE — ED Triage Notes (Signed)
[place] [employer] [place] via [place] with bilateral leg pain and "heaviness". Pt states she has just finished a 24h bus traveling day from [place]. Reporting sciatica and leg swelling as well. Denies any cp or sob

## 2022-09-07 NOTE — ED Notes (Signed)
Pt ambulated independently to restroom

## 2023-09-02 ENCOUNTER — Other Ambulatory Visit (HOSPITAL_COMMUNITY): Payer: Self-pay | Admitting: Physician Assistant
